# Patient Record
Sex: Female | Born: 1970 | Race: White | Hispanic: No | Marital: Married | Smoking: Never smoker
Health system: Southern US, Community
[De-identification: ages and names within clinical notes are randomized; demographics above are authoritative.]

## PROBLEM LIST (undated history)

## (undated) ENCOUNTER — Inpatient Hospital Stay (HOSPITAL_COMMUNITY): Payer: Self-pay

## (undated) DIAGNOSIS — Z789 Other specified health status: Secondary | ICD-10-CM

## (undated) DIAGNOSIS — N841 Polyp of cervix uteri: Secondary | ICD-10-CM

## (undated) HISTORY — DX: Polyp of cervix uteri: N84.1

## (undated) HISTORY — PX: NO PAST SURGERIES: SHX2092

---

## 2013-06-23 ENCOUNTER — Encounter (HOSPITAL_COMMUNITY): Payer: Self-pay | Admitting: Emergency Medicine

## 2013-06-23 ENCOUNTER — Emergency Department (INDEPENDENT_AMBULATORY_CARE_PROVIDER_SITE_OTHER)
Admission: EM | Admit: 2013-06-23 | Discharge: 2013-06-23 | Disposition: A | Discharge status: Home / Self Care | Payer: Medicaid Other | Source: Home / Self Care | Attending: Family Medicine | Admitting: Family Medicine

## 2013-06-23 DIAGNOSIS — N911 Secondary amenorrhea: Secondary | ICD-10-CM

## 2013-06-23 DIAGNOSIS — N912 Amenorrhea, unspecified: Secondary | ICD-10-CM

## 2013-06-23 LAB — POCT URINALYSIS DIP (DEVICE)
BILIRUBIN URINE: NEGATIVE
GLUCOSE, UA: NEGATIVE mg/dL
Ketones, ur: 15 mg/dL — AB
Leukocytes, UA: NEGATIVE
NITRITE: NEGATIVE
PH: 5.5 (ref 5.0–8.0)
Protein, ur: NEGATIVE mg/dL
SPECIFIC GRAVITY, URINE: 1.01 (ref 1.005–1.030)
UROBILINOGEN UA: 0.2 mg/dL (ref 0.0–1.0)

## 2013-06-23 LAB — POCT I-STAT, CHEM 8
BUN: 4 mg/dL — ABNORMAL LOW (ref 6–23)
CALCIUM ION: 1.18 mmol/L (ref 1.12–1.23)
CHLORIDE: 104 meq/L (ref 96–112)
CREATININE: 0.6 mg/dL (ref 0.50–1.10)
Glucose, Bld: 87 mg/dL (ref 70–99)
HEMATOCRIT: 40 % (ref 36.0–46.0)
Hemoglobin: 13.6 g/dL (ref 12.0–15.0)
POTASSIUM: 3.4 meq/L — AB (ref 3.7–5.3)
Sodium: 141 mEq/L (ref 137–147)
TCO2: 22 mmol/L (ref 0–100)

## 2013-06-23 LAB — HCG, SERUM, QUALITATIVE: Preg, Serum: POSITIVE — AB

## 2013-06-23 LAB — POCT PREGNANCY, URINE: Preg Test, Ur: NEGATIVE

## 2013-06-23 NOTE — Discharge Instructions (Signed)
We will call on fri with pregnancy blood test, I feel her problem is stress related and will resolve when she has a cycle.

## 2013-06-23 NOTE — ED Provider Notes (Signed)
CSN: [id number]     Arrival date & time 06/23/13  1641 History   First MD Initiated Contact with Patient 06/23/13 1734     Chief Complaint  Patient presents with  . Weakness   (Consider location/radiation/quality/duration/timing/severity/associated sxs/prior Treatment) Patient is a 43 y.o. female presenting with weakness. The history is provided by the patient and the spouse. The history is limited by a language barrier. Language interpreter used: husband trans.  Weakness This is a new problem. The current episode started more than 2 days ago. Associated symptoms comments: Did home preg test pos today, late on menses, came to [place] just prior to lmp in feb., no birth control.Marland Kitchen.    History reviewed. No pertinent past medical history. History reviewed. No pertinent past surgical history. No family history on file. History  Substance Use Topics  . Smoking status: Never Smoker   . Smokeless tobacco: Not on file  . Alcohol Use: No   OB History   Grav Para Term Preterm Abortions TAB SAB Ect Mult Living                 Review of Systems  Respiratory: Negative.   Cardiovascular: Negative.   Gastrointestinal: Negative.   Genitourinary: Positive for menstrual problem.  Neurological: Positive for weakness.    Allergies  Review of patient's allergies indicates no known allergies.  Home Medications  No current outpatient prescriptions on file. BP 122/76  Pulse 69  Temp(Src) 98.3 F (36.8 C) (Oral)  Resp 18  SpO2 100%  LMP 05/19/2013 Physical Exam  Nursing note and vitals reviewed. Constitutional: She is oriented to person, place, and time. She appears well-developed and well-nourished.  Neck: Normal range of motion. Neck supple.  Cardiovascular: Normal rate and normal heart sounds.   Pulmonary/Chest: Effort normal and breath sounds normal.  Abdominal: Soft. Bowel sounds are normal. She exhibits no mass. There is no tenderness. There is no rebound and no guarding.   Lymphadenopathy:    She has no cervical adenopathy.  Neurological: She is alert and oriented to person, place, and time.  Skin: Skin is warm and dry.    ED Course  Procedures (including critical care time) Labs Review Labs Reviewed  POCT URINALYSIS DIP (DEVICE) - Abnormal; Notable for the following:    Ketones, ur 15 (*)    Hgb urine dipstick TRACE (*)    All other components within normal limits  POCT I-STAT, CHEM 8 - Abnormal; Notable for the following:    Potassium 3.4 (*)    BUN 4 (*)    All other components within normal limits  HCG, SERUM, QUALITATIVE  POCT PREGNANCY, URINE   Imaging Review No results found. i-stat wnl. U/a neg.  MDM   1. Amenorrhea, secondary        Linna HoffJames D Kindl, MD 06/23/13 [phone]

## 2013-06-23 NOTE — ED Notes (Signed)
[place].  Patient's last period was February 26.  Patient has performed a home pregnancy test and reports it is positive.  C/o feeling weak, hot , legs hurting.

## 2013-06-24 ENCOUNTER — Inpatient Hospital Stay (HOSPITAL_COMMUNITY): Payer: Medicaid Other

## 2013-06-24 ENCOUNTER — Telehealth (HOSPITAL_COMMUNITY): Payer: Self-pay

## 2013-06-24 ENCOUNTER — Encounter (HOSPITAL_COMMUNITY): Payer: Self-pay

## 2013-06-24 ENCOUNTER — Inpatient Hospital Stay (HOSPITAL_COMMUNITY)
Admission: AD | Admit: 2013-06-24 | Discharge: 2013-06-24 | Disposition: A | Discharge status: Home / Self Care | Payer: Medicaid Other | Source: Ambulatory Visit | Attending: Obstetrics and Gynecology | Admitting: Obstetrics and Gynecology

## 2013-06-24 DIAGNOSIS — R109 Unspecified abdominal pain: Secondary | ICD-10-CM | POA: Insufficient documentation

## 2013-06-24 DIAGNOSIS — N898 Other specified noninflammatory disorders of vagina: Secondary | ICD-10-CM | POA: Insufficient documentation

## 2013-06-24 DIAGNOSIS — R5383 Other fatigue: Secondary | ICD-10-CM

## 2013-06-24 DIAGNOSIS — R5381 Other malaise: Secondary | ICD-10-CM | POA: Insufficient documentation

## 2013-06-24 DIAGNOSIS — R51 Headache: Secondary | ICD-10-CM | POA: Insufficient documentation

## 2013-06-24 DIAGNOSIS — R110 Nausea: Secondary | ICD-10-CM

## 2013-06-24 DIAGNOSIS — O9989 Other specified diseases and conditions complicating pregnancy, childbirth and the puerperium: Principal | ICD-10-CM

## 2013-06-24 DIAGNOSIS — O26899 Other specified pregnancy related conditions, unspecified trimester: Secondary | ICD-10-CM

## 2013-06-24 DIAGNOSIS — O99891 Other specified diseases and conditions complicating pregnancy: Secondary | ICD-10-CM | POA: Insufficient documentation

## 2013-06-24 DIAGNOSIS — R1084 Generalized abdominal pain: Secondary | ICD-10-CM

## 2013-06-24 HISTORY — DX: Other specified health status: Z78.9

## 2013-06-24 LAB — CBC
HEMATOCRIT: 35.4 % — AB (ref 36.0–46.0)
HEMOGLOBIN: 12 g/dL (ref 12.0–15.0)
MCH: 30.2 pg (ref 26.0–34.0)
MCHC: 33.9 g/dL (ref 30.0–36.0)
MCV: 88.9 fL (ref 78.0–100.0)
Platelets: 214 10*3/uL (ref 150–400)
RBC: 3.98 MIL/uL (ref 3.87–5.11)
RDW: 12 % (ref 11.5–15.5)
WBC: 7.6 10*3/uL (ref 4.0–10.5)

## 2013-06-24 LAB — WET PREP, GENITAL
CLUE CELLS WET PREP: NONE SEEN
TRICH WET PREP: NONE SEEN
YEAST WET PREP: NONE SEEN

## 2013-06-24 LAB — OB RESULTS CONSOLE GC/CHLAMYDIA
Chlamydia: NEGATIVE
GC PROBE AMP, GENITAL: NEGATIVE

## 2013-06-24 LAB — ABO/RH: ABO/RH(D): B POS

## 2013-06-24 LAB — URINALYSIS, ROUTINE W REFLEX MICROSCOPIC
BILIRUBIN URINE: NEGATIVE
Glucose, UA: NEGATIVE mg/dL
KETONES UR: NEGATIVE mg/dL
Leukocytes, UA: NEGATIVE
Nitrite: NEGATIVE
PH: 5.5 (ref 5.0–8.0)
Protein, ur: NEGATIVE mg/dL
SPECIFIC GRAVITY, URINE: 1.01 (ref 1.005–1.030)
Urobilinogen, UA: 0.2 mg/dL (ref 0.0–1.0)

## 2013-06-24 LAB — URINE MICROSCOPIC-ADD ON

## 2013-06-24 LAB — HCG, QUANTITATIVE, PREGNANCY: hCG, Beta Chain, Quant, S: 14759 m[IU]/mL — ABNORMAL HIGH (ref <5)

## 2013-06-24 MED ORDER — ACETAMINOPHEN 325 MG PO TABS
650.0000 mg | ORAL_TABLET | Freq: Once | ORAL | Status: DC
  Filled 2013-06-24: qty 2

## 2013-06-24 NOTE — Discharge Instructions (Signed)
Abdominal Pain During Pregnancy  Belly (abdominal) pain is common during pregnancy. Most of the time, it is not a serious problem. Other times, it can be a sign that something is wrong with the pregnancy. Always tell your doctor if you have belly pain.  HOME CARE  Monitor your belly pain for any changes. The following actions may help you feel better:  · Do not have sex (intercourse) or put anything in your vagina until you feel better.  · Rest until your pain stops.  · Drink clear fluids if you feel sick to your stomach (nauseous). Do not eat solid food until you feel better.  · Only take medicine as told by your doctor.  · Keep all doctor visits as told.  GET HELP RIGHT AWAY IF:   · You are bleeding, leaking fluid, or pieces of tissue come out of your vagina.  · You have more pain or cramping.  · You keep throwing up (vomiting).  · You have pain when you pee (urinate) or have blood in your pee.  · You have a fever.  · You do not feel your baby moving as much.  · You feel very weak or feel like passing out.  · You have trouble breathing, with or without belly pain.  · You have a very bad headache and belly pain.  · You have fluid leaking from your vagina and belly pain.  · You keep having watery poop (diarrhea).  · Your belly pain does not go away after resting, or the pain gets worse.  MAKE SURE YOU:   · Understand these instructions.  · Will watch your condition.  · Will get help right away if you are not doing well or get worse.  Document Released: 02/26/2009 Document Revised: 11/10/2012 Document Reviewed: 10/07/2012  ExitCare® Patient Information ©2014 [employer].

## 2013-06-24 NOTE — ED Notes (Signed)
Patient's husband called asking for lab results.  Chart reviewed by Dr Artis FlockKindl.  Patient unable to speak English husband is Nurse, learning disabilitytranslator;  Husband made aware that blood test was positive for pregnancy and [facility].  Husband expressed understanding and was given address as well as [facility] for [facility]

## 2013-06-24 NOTE — MAU Note (Signed)
Patient was seen at Urgent Care yesterday and had a positive serum pregnancy test. [facility]uru[facility], headache and nausea. Denies bleeding.

## 2013-06-24 NOTE — MAU Provider Note (Signed)
CSN: [id number]     Arrival date & time 06/24/13  1713 History   None    Chief Complaint  Patient presents with  . Abdominal Pain  . Headache  . Nausea     (Consider location/radiation/quality/duration/timing/severity/associated sxs/prior Treatment) Patient is a 43 y.o. female presenting with abdominal pain.  Abdominal Pain The primary symptoms of the illness include abdominal pain, fatigue and vaginal discharge. The primary symptoms of the illness do not include fever, shortness of breath, nausea, vomiting, diarrhea, dysuria or vaginal bleeding. The current episode started yesterday. The onset of the illness was gradual.  The vaginal discharge is not associated with dysuria.   The patient states that she believes she is currently pregnant. Additional symptoms associated with the illness include back pain. Symptoms associated with the illness do not include chills.   Jill [name] is a 42 y.o. female who presents to the ED with abdominal cramping, headache and feeling light headed for the past couple days. She went to Urgent Care yesterday and had negative UPT but a positive Serum pregnancy. This is her fist pregnancy.    No past medical history on file. No past surgical history on file. No family history on file. History  Substance Use Topics  . Smoking status: Never Smoker   . Smokeless tobacco: Not on file  . Alcohol Use: No   OB History   Grav Para Term Preterm Abortions TAB SAB Ect Mult Living   1              Review of Systems  Constitutional: Positive for fatigue. Negative for fever and chills.  Eyes: Negative for visual disturbance.  Respiratory: Negative for shortness of breath.   Cardiovascular: Negative for chest pain.  Gastrointestinal: Positive for abdominal pain. Negative for nausea, vomiting and diarrhea.  Genitourinary: Positive for vaginal discharge. Negative for dysuria and vaginal bleeding.  Musculoskeletal: Positive for back pain. Negative for myalgias.   Skin: Negative for rash.  Neurological: Positive for light-headedness and headaches.  Psychiatric/Behavioral: The patient is not nervous/anxious.       Allergies  Review of patient's allergies indicates no known allergies.  Home Medications  No current outpatient prescriptions on file. BP 108/75  Pulse 83  Temp(Src) 99.1 F (37.3 C) (Oral)  Resp 16  Ht 4\' 11"  (1.499 m)  Wt 109 lb 9.6 oz (49.714 kg)  BMI 22.12 kg/m2  SpO2 99%  LMP 05/19/2013 Physical Exam  Nursing note and vitals reviewed. Constitutional: She is oriented to person, place, and time. She appears well-developed and well-nourished. No distress.  HENT:  Head: Normocephalic.  Eyes: EOM are normal.  Neck: Neck supple.  Cardiovascular: Normal rate.   Pulmonary/Chest: Effort normal.  Abdominal: Soft. There is tenderness.  Genitourinary:  External genitalia without lesions, cervix long, closed, bilateral adnexal tenderness, uterus slightly enlarged.   Musculoskeletal: Normal range of motion.  Neurological: She is alert and oriented to person, place, and time. No cranial nerve deficit.  Skin: Skin is warm and dry.  Psychiatric: She has a normal mood and affect. Her behavior is normal.   Results for orders placed during the hospital encounter of 06/24/13 (from the past 24 hour(s))  URINALYSIS, ROUTINE W REFLEX MICROSCOPIC     Status: Abnormal   Collection Time    06/24/13  5:35 PM      Result Value Ref Range   Color, Urine YELLOW  YELLOW   APPearance CLEAR  CLEAR   Specific Gravity, Urine 1.010  1.005 - 1.030  pH 5.5  5.0 - 8.0   Glucose, UA NEGATIVE  NEGATIVE mg/dL   Hgb urine dipstick TRACE (*) NEGATIVE   Bilirubin Urine NEGATIVE  NEGATIVE   Ketones, ur NEGATIVE  NEGATIVE mg/dL   Protein, ur NEGATIVE  NEGATIVE mg/dL   Urobilinogen, UA 0.2  0.0 - 1.0 mg/dL   Nitrite NEGATIVE  NEGATIVE   Leukocytes, UA NEGATIVE  NEGATIVE  URINE MICROSCOPIC-ADD ON     Status: Abnormal   Collection Time    06/24/13  5:35  PM      Result Value Ref Range   Squamous Epithelial / LPF FEW (*) RARE  CBC     Status: Abnormal   Collection Time    06/24/13  6:00 PM      Result Value Ref Range   WBC 7.6  4.0 - 10.5 K/uL   RBC 3.98  3.87 - 5.11 MIL/uL   Hemoglobin 12.0  12.0 - 15.0 g/dL   HCT 09.8 (*) 11.9 - 14.7 %   MCV 88.9  78.0 - 100.0 fL   MCH 30.2  26.0 - 34.0 pg   MCHC 33.9  30.0 - 36.0 g/dL   RDW 82.9  56.2 - 13.0 %   Platelets 214  150 - 400 K/uL  ABO/RH     Status: None   Collection Time    06/24/13  6:00 PM      Result Value Ref Range   ABO/RH(D) B POS    HCG, QUANTITATIVE, PREGNANCY     Status: Abnormal   Collection Time    06/24/13  6:00 PM      Result Value Ref Range   hCG, Beta Chain, Quant, S 14759 (*) <5 mIU/mL   US Ob Comp Less 14 Wks  06/24/2013   CLINICAL DATA:  Pain.  EXAM: OBSTETRIC <14 WK [place] AND TRANSVAGINAL OB US  TECHNIQUE: Both transabdominal and transvaginal ultrasound examinations were performed for complete evaluation of the gestation as well as the maternal uterus, adnexal regions, and pelvic cul-de-sac. Transvaginal technique was performed to assess early pregnancy.  COMPARISON:  None.  FINDINGS: Intrauterine gestational sac: Visualized/normal in shape.  Yolk sac:  Yes.  Embryo:  None identified.  MSD:  8.8   mm   5 w   4  d  [place] EDC: 02/20/2014.  Maternal uterus/adnexae: 1.7 x 1.0 x 1.1 cm cyst, most likely corpus luteal cyst, right ovary.  No free pelvic fluid.  IMPRESSION: Findings most consistent with early 5 week 4 day intrauterine pregnancy. No fetal pole is identified at this time, a process such as ectopic pregnancy cannot be completely excluded. Follow-up pelvic ultrasound and follow-up beta HCG suggested for continued evaluation.   Electronically Signed   By: Maisie Fus  Register   On: 06/24/2013 19:50   US Ob Transvaginal  06/24/2013   CLINICAL DATA:  Pain.  EXAM: OBSTETRIC <14 WK [place] AND TRANSVAGINAL OB US  TECHNIQUE: Both transabdominal and transvaginal ultrasound examinations  were performed for complete evaluation of the gestation as well as the maternal uterus, adnexal regions, and pelvic cul-de-sac. Transvaginal technique was performed to assess early pregnancy.  COMPARISON:  None.  FINDINGS: Intrauterine gestational sac: Visualized/normal in shape.  Yolk sac:  Yes.  Embryo:  None identified.  MSD:  8.8   mm   5 w   4  d  [place] EDC: 02/20/2014.  Maternal uterus/adnexae: 1.7 x 1.0 x 1.1 cm cyst, most likely corpus luteal cyst, right ovary.  No free pelvic fluid.  IMPRESSION:  Findings most consistent with early 5 week 4 day intrauterine pregnancy. No fetal pole is identified at this time, a process such as ectopic pregnancy cannot be completely excluded. Follow-up pelvic ultrasound and follow-up beta HCG suggested for continued evaluation.   Electronically Signed   By: Maisie Fushomas  Register   On: 06/24/2013 19:50    ED Course  Procedures  Dr. Emelda FearFerguson in to see the patient and discuss ultrasound results. MDM  43 y.o. female with abdominal pain and feeling weak in early pregnancy. Stable for discharge with normal labs and ultrasound showing a 5 week 4 days IUGS with YS. She [facility]. Message sent to clinic for schedule follow up.

## 2013-06-24 NOTE — MAU Note (Signed)
Speaks ChadPersian, so her husband translates for her;  C/o leg pain, cramping and headache; c/o yellowish vaginal discharge;

## 2013-06-25 LAB — GC/CHLAMYDIA PROBE AMP
CT PROBE, AMP APTIMA: NEGATIVE
GC PROBE AMP APTIMA: NEGATIVE

## 2013-06-28 NOTE — MAU Provider Note (Signed)
`````  Attestation of Attending Supervision of Advanced Practitioner: Evaluation and management procedures were performed by the PA/NP/CNM/OB Fellow under my supervision/collaboration. Chart reviewed and agree with management and plan.  Devonte Migues V 06/28/2013 11:01 PM    

## 2013-06-29 ENCOUNTER — Encounter (HOSPITAL_COMMUNITY): Payer: Self-pay

## 2013-06-29 ENCOUNTER — Inpatient Hospital Stay (HOSPITAL_COMMUNITY)
Admission: AD | Admit: 2013-06-29 | Discharge: 2013-06-29 | Disposition: A | Discharge status: Home / Self Care | Payer: Medicaid Other | Source: Ambulatory Visit | Attending: Family Medicine | Admitting: Family Medicine

## 2013-06-29 DIAGNOSIS — O99891 Other specified diseases and conditions complicating pregnancy: Secondary | ICD-10-CM | POA: Insufficient documentation

## 2013-06-29 DIAGNOSIS — O9989 Other specified diseases and conditions complicating pregnancy, childbirth and the puerperium: Secondary | ICD-10-CM

## 2013-06-29 DIAGNOSIS — R5381 Other malaise: Secondary | ICD-10-CM | POA: Insufficient documentation

## 2013-06-29 DIAGNOSIS — R51 Headache: Secondary | ICD-10-CM | POA: Insufficient documentation

## 2013-06-29 DIAGNOSIS — R5383 Other fatigue: Secondary | ICD-10-CM

## 2013-06-29 DIAGNOSIS — O210 Mild hyperemesis gravidarum: Secondary | ICD-10-CM | POA: Insufficient documentation

## 2013-06-29 DIAGNOSIS — O26811 Pregnancy related exhaustion and fatigue, first trimester: Secondary | ICD-10-CM

## 2013-06-29 LAB — COMPREHENSIVE METABOLIC PANEL
ALK PHOS: 39 U/L (ref 39–117)
ALT: 8 U/L (ref 0–35)
AST: 13 U/L (ref 0–37)
Albumin: 3.8 g/dL (ref 3.5–5.2)
BUN: 6 mg/dL (ref 6–23)
CO2: 23 mEq/L (ref 19–32)
CREATININE: 0.64 mg/dL (ref 0.50–1.10)
Calcium: 9.1 mg/dL (ref 8.4–10.5)
Chloride: 102 mEq/L (ref 96–112)
GFR calc non Af Amer: >90 mL/min (ref >90)
GLUCOSE: 91 mg/dL (ref 70–99)
Potassium: 4 mEq/L (ref 3.7–5.3)
Sodium: 137 mEq/L (ref 137–147)
TOTAL PROTEIN: 6.6 g/dL (ref 6.0–8.3)
Total Bilirubin: 0.5 mg/dL (ref 0.3–1.2)

## 2013-06-29 LAB — URINALYSIS, ROUTINE W REFLEX MICROSCOPIC
BILIRUBIN URINE: NEGATIVE
Glucose, UA: NEGATIVE mg/dL
HGB URINE DIPSTICK: NEGATIVE
KETONES UR: NEGATIVE mg/dL
Leukocytes, UA: NEGATIVE
Nitrite: NEGATIVE
Protein, ur: NEGATIVE mg/dL
UROBILINOGEN UA: 0.2 mg/dL (ref 0.0–1.0)
pH: 6 (ref 5.0–8.0)

## 2013-06-29 NOTE — MAU Provider Note (Signed)
History     CSN: [id number]  Arrival date and time: 06/29/13 1112   None     Chief Complaint  Patient presents with  . Headache  . Nausea  . Fatigue   HPI  Pt is 43 yo G1P0 at 4384w6d wks IUP here with report of headache, fatigue, and dizziness for past two days.  Reports not eating due to lack of desire.  +nausea, but no vomiting.  No report of fever or chills.  Legs feel weak when walking.  No vaginal bleeding or pelvic pain.    Past Medical History  Diagnosis Date  . Medical history non-contributory     Past Surgical History  Procedure Laterality Date  . No past surgeries      Family History  Problem Relation Age of Onset  . Diabetes Mother   . Heart disease Mother     History  Substance Use Topics  . Smoking status: Never Smoker   . Smokeless tobacco: Not on file  . Alcohol Use: No    Allergies: No Known Allergies  No prescriptions prior to admission    Review of Systems  Constitutional: Positive for malaise/fatigue. Negative for fever and chills.  HENT: Negative for sore throat.   Cardiovascular: Negative for chest pain.  Gastrointestinal: Positive for nausea. Negative for vomiting, abdominal pain, diarrhea and constipation.  Genitourinary: Negative for dysuria.  Neurological: Positive for dizziness, weakness and headaches. Negative for loss of consciousness.   Physical Exam   Blood pressure 100/57, pulse 72, temperature 98.2 F (36.8 C), temperature source Oral, resp. rate 16, height 4\' 11"  (1.499 m), weight 49.351 kg (108 lb 12.8 oz), last menstrual period 05/19/2013.  Physical Exam  Constitutional: She is oriented to person, place, and time. She appears well-developed and well-nourished. No distress.  HENT:  Head: Normocephalic.  Mouth/Throat: Mucous membranes are not dry.  Neck: Normal range of motion. Neck supple.  Cardiovascular: Normal rate, regular rhythm and normal heart sounds.   Respiratory: Effort normal and breath sounds normal. No  respiratory distress.  GI: Soft. There is no tenderness.  Genitourinary: Right adnexum displays no mass, no tenderness and no fullness. Left adnexum displays no mass, no tenderness and no fullness. No bleeding around the vagina.  Musculoskeletal: Normal range of motion.       Right shoulder: She exhibits normal range of motion and normal strength.  Neurological: She is alert and oriented to person, place, and time.  Skin: Skin is warm and dry.    MAU Course  Procedures Results for orders placed during the hospital encounter of 06/29/13 (from the past 24 hour(s))  URINALYSIS, ROUTINE W REFLEX MICROSCOPIC     Status: Abnormal   Collection Time    06/29/13 11:35 AM      Result Value Ref Range   Color, Urine YELLOW  YELLOW   APPearance CLEAR  CLEAR   Specific Gravity, Urine <1.005 (*) 1.005 - 1.030   pH 6.0  5.0 - 8.0   Glucose, UA NEGATIVE  NEGATIVE mg/dL   Hgb urine dipstick NEGATIVE  NEGATIVE   Bilirubin Urine NEGATIVE  NEGATIVE   Ketones, ur NEGATIVE  NEGATIVE mg/dL   Protein, ur NEGATIVE  NEGATIVE mg/dL   Urobilinogen, UA 0.2  0.0 - 1.0 mg/dL   Nitrite NEGATIVE  NEGATIVE   Leukocytes, UA NEGATIVE  NEGATIVE  COMPREHENSIVE METABOLIC PANEL     Status: None   Collection Time    06/29/13  1:41 PM      Result Value  Ref Range   Sodium 137  137 - 147 mEq/L   Potassium 4.0  3.7 - 5.3 mEq/L   Chloride 102  96 - 112 mEq/L   CO2 23  19 - 32 mEq/L   Glucose, Bld 91  70 - 99 mg/dL   BUN 6  6 - 23 mg/dL   Creatinine, Ser 4.09  0.50 - 1.10 mg/dL   Calcium 9.1  8.4 - 81.1 mg/dL   Total Protein 6.6  6.0 - 8.3 g/dL   Albumin 3.8  3.5 - 5.2 g/dL   AST 13  0 - 37 U/L   ALT 8  0 - 35 U/L   Alkaline Phosphatase 39  39 - 117 U/L   Total Bilirubin 0.5  0.3 - 1.2 mg/dL   GFR calc non Af Amer >90  >90 mL/min   GFR calc Af Amer >90  >90 mL/min     Assessment and Plan  Fatigue of Pregnancy  Plan: Advised increase rest and fluids. Tylenol as needed for headache.  Emphasized importance of  food intake. Begin prenatal care.  Eino Farber N Muhammad 06/29/2013, 1:30 PM

## 2013-06-29 NOTE — MAU Note (Addendum)
Been feeling dizzy, legs are weak. Ongoing headache.  Tylenol when she takes it helps, but she is afraid to take it.  Been feeling nauseated

## 2013-06-29 NOTE — Discharge Instructions (Signed)
For nausea and vomiting: Unisom 12.5 mg at bedtime Vitamin B6 25mg  at bedtime and in the morning.  HEALTHY EATING PLAN  The My Pyramid plan for Moms outlines what you should eat to help you and your baby stay healthy.   HOME CARE INSTRUCTIONS  For a personal plan based on your unique needs, see your Registered Dietitian or visit collegescenetv.comwww.mypyramid.gov.  Eat a variety of foods. The plan below will help guide you. The following chart has a suggested daily meal plan from the My Pyramid for Moms.  Eat a variety of fruits and vegetables.  Eat more dark green and orange vegetables and cooked dried beans.  Make half your grains whole grains. Choose whole instead of refined grains.  Choose low-fat or lean meats and poultry.  Choose low-fat or fat-free dairy products like milk, cheese, or yogurt. Fruits  Non-Breastfeeding: 2 cups  What Counts as a serving?  1 cup of fruit or juice.   cup dried fruit. Vegetables  Non-Breastfeeding: 2  cups  What Counts as a serving?  1 cup raw or cooked vegetables.  Juice or 2 cups raw leafy vegetables. Grains  Non-Breastfeeding: 6 oz  What Counts as a serving?  1 slice bread.  1 oz ready-to-eat cereal.   cup cooked pasta, rice, or cereal. Meat and Beans  Non-Breastfeeding: 5  oz  What Counts as a serving?  1 oz lean meat, poultry, or fish   cup cooked dry beans   oz nuts or 1 egg  1 tbs peanut butter Milk  Non-Breastfeeding: 3 cups  What Counts as a serving?  1 cup milk.  8 oz yogurt.  1  oz cheese.  2 oz processed cheese.

## 2013-06-30 NOTE — MAU Provider Note (Signed)
Attestation of Attending Supervision of Advanced Practitioner (PA/CNM/NP): Evaluation and management procedures were performed by the Advanced Practitioner under my supervision and collaboration.  I have reviewed the Advanced Practitioner's note and chart, and I agree with the management and plan.  Dashonda Bonneau, DO Attending Physician Faculty Practice, [facility]  

## 2013-07-01 ENCOUNTER — Encounter: Payer: Self-pay | Admitting: Family Medicine

## 2013-07-06 ENCOUNTER — Ambulatory Visit: Payer: Self-pay

## 2013-07-15 ENCOUNTER — Encounter (HOSPITAL_COMMUNITY): Payer: Self-pay

## 2013-07-15 ENCOUNTER — Inpatient Hospital Stay (HOSPITAL_COMMUNITY): Payer: Medicaid Other

## 2013-07-15 ENCOUNTER — Inpatient Hospital Stay (HOSPITAL_COMMUNITY)
Admission: AD | Admit: 2013-07-15 | Discharge: 2013-07-16 | Disposition: A | Discharge status: Home / Self Care | Payer: Medicaid Other | Source: Ambulatory Visit | Attending: Obstetrics & Gynecology | Admitting: Obstetrics & Gynecology

## 2013-07-15 DIAGNOSIS — O30001 Twin pregnancy, unspecified number of placenta and unspecified number of amniotic sacs, first trimester: Secondary | ICD-10-CM

## 2013-07-15 DIAGNOSIS — O3441 Maternal care for other abnormalities of cervix, first trimester: Secondary | ICD-10-CM | POA: Diagnosis present

## 2013-07-15 DIAGNOSIS — O30009 Twin pregnancy, unspecified number of placenta and unspecified number of amniotic sacs, unspecified trimester: Secondary | ICD-10-CM | POA: Insufficient documentation

## 2013-07-15 DIAGNOSIS — O30031 Twin pregnancy, monochorionic/diamniotic, first trimester: Secondary | ICD-10-CM | POA: Diagnosis present

## 2013-07-15 DIAGNOSIS — K5900 Constipation, unspecified: Secondary | ICD-10-CM | POA: Insufficient documentation

## 2013-07-15 DIAGNOSIS — O208 Other hemorrhage in early pregnancy: Secondary | ICD-10-CM | POA: Insufficient documentation

## 2013-07-15 DIAGNOSIS — N841 Polyp of cervix uteri: Secondary | ICD-10-CM

## 2013-07-15 DIAGNOSIS — O3440 Maternal care for other abnormalities of cervix, unspecified trimester: Secondary | ICD-10-CM | POA: Insufficient documentation

## 2013-07-15 DIAGNOSIS — N949 Unspecified condition associated with female genital organs and menstrual cycle: Secondary | ICD-10-CM | POA: Insufficient documentation

## 2013-07-15 LAB — URINALYSIS, ROUTINE W REFLEX MICROSCOPIC
Bilirubin Urine: NEGATIVE
Glucose, UA: NEGATIVE mg/dL
Hgb urine dipstick: NEGATIVE
KETONES UR: 15 mg/dL — AB
Leukocytes, UA: NEGATIVE
NITRITE: NEGATIVE
PH: 5.5 (ref 5.0–8.0)
Protein, ur: NEGATIVE mg/dL
Specific Gravity, Urine: 1.015 (ref 1.005–1.030)
Urobilinogen, UA: 0.2 mg/dL (ref 0.0–1.0)

## 2013-07-15 LAB — POCT PREGNANCY, URINE: Preg Test, Ur: POSITIVE — AB

## 2013-07-15 NOTE — MAU Note (Signed)
Vaginal pain while sitting started 3-4 days ago but worse today.  Feels like something "like a piece of meat is coming out".  Denies bleeding.

## 2013-07-15 NOTE — MAU Provider Note (Signed)
Chief Complaint: Vaginal Pain   First Provider Initiated Contact with Patient 07/15/13 2249     SUBJECTIVE HPI: Jill [name] is a 43 y.o. G1P0 at 2928w1d by LMP who presents to maternity admissions reporting a sensation that "something was coming out" while having a bowel movement and pain when sitting down afterwards.  She does report some mild constipation x2-3 days but with BM today.  She denies abdominal pain, vaginal bleeding, vaginal itching/burning, urinary symptoms, h/a, dizziness, n/v, or fever/chills.   Pt is taking Unisom/Vitamin B6 which is helping her nausea.  Her husband reports she is afraid to gain weight in this pregnancy and asks questions about how many calories she should eat daily.   Past Medical History  Diagnosis Date  . Medical history non-contributory    Past Surgical History  Procedure Laterality Date  . No past surgeries     History   Social History  . Marital Status: Married    Spouse Name: N/A    Number of Children: N/A  . Years of Education: N/A   Occupational History  . Not on file.   Social History Main Topics  . Smoking status: Never Smoker   . Smokeless tobacco: Never Used  . Alcohol Use: No  . Drug Use: No  . Sexual Activity: Not on file   Other Topics Concern  . Not on file   Social History Narrative  . No narrative on file   No current facility-administered medications on file prior to encounter.   No current outpatient prescriptions on file prior to encounter.   No Known Allergies  ROS: Pertinent items in HPI  OBJECTIVE Blood pressure 117/78, pulse 94, temperature 98.1 F (36.7 C), temperature source Oral, resp. rate 18, height 4' 11.75" (1.518 m), weight 49.079 kg (108 lb 3.2 oz), last menstrual period 05/19/2013, SpO2 100.00%. GENERAL: Well-developed, well-nourished female in no acute distress.  HEENT: Normocephalic HEART: normal rate RESP: normal effort ABDOMEN: Soft, non-tender EXTREMITIES: Nontender, no edema NEURO:  Alert and oriented Pelvic exam: Cervix pink, visually closed, with 1x1x4cm polyp extending from cervical os, scant white creamy discharge, vaginal walls and external genitalia normal  Of note, pt extremely uncomfortable with cervical exam, tearful, leaning on and looking to her husband for support during exam.    LAB RESULTS Results for orders placed during the hospital encounter of 07/15/13 (from the past 24 hour(s))  URINALYSIS, ROUTINE W REFLEX MICROSCOPIC     Status: Abnormal   Collection Time    07/15/13 10:00 PM      Result Value Ref Range   Color, Urine YELLOW  YELLOW   APPearance CLEAR  CLEAR   Specific Gravity, Urine 1.015  1.005 - 1.030   pH 5.5  5.0 - 8.0   Glucose, UA NEGATIVE  NEGATIVE mg/dL   Hgb urine dipstick NEGATIVE  NEGATIVE   Bilirubin Urine NEGATIVE  NEGATIVE   Ketones, ur 15 (*) NEGATIVE mg/dL   Protein, ur NEGATIVE  NEGATIVE mg/dL   Urobilinogen, UA 0.2  0.0 - 1.0 mg/dL   Nitrite NEGATIVE  NEGATIVE   Leukocytes, UA NEGATIVE  NEGATIVE  POCT PREGNANCY, URINE     Status: Abnormal   Collection Time    07/15/13 10:12 PM      Result Value Ref Range   Preg Test, Ur POSITIVE (*) NEGATIVE    IMAGING [place] Ob Comp Less 14 Wks  07/16/2013   CLINICAL DATA:  Twin pregnancy  EXAM: TWIN OBSTETRICAL ULTRASOUND <14 WKS  COMPARISON:  None.  FINDINGS: TWIN 1  Intrauterine gestational sac: Visualized/normal in shape.  Yolk sac:  Present  Embryo:  Present  Cardiac Activity: Present  Heart Rate: 176 bpm  CRL:   20.4  mm   8 w 5 d                  US EDC: 02/19/2014  TWIN 2  Intrauterine gestational sac: Visualized/normal in shape.  Yolk sac:  Present  Embryo:  Present  Cardiac Activity: Present  Heart Rate: 179 bpm  CRL:   18.4  mm   8 w 3 d                  US EDC: 02/21/2014  Maternal uterus/adnexae:  Single gestational sac with dividing membrane.  Small subchorionic hemorrhage.  Uterus otherwise normal in appearance.  No free pelvic fluid.  Cervical polyp noted on physical exam is  not sonographically evident.  IMPRESSION: Live twin intrauterine gestations as above.  Small subchorionic hemorrhage.   Electronically Signed   By: Ulyses SouthwardMark  Boles M.D.   On: 07/16/2013 01:23   [place] Ob Comp Addl Gest Less 14 Wks  07/16/2013   CLINICAL DATA:  Twin pregnancy  EXAM: TWIN OBSTETRICAL ULTRASOUND <14 WKS  COMPARISON:  None.  FINDINGS: TWIN 1  Intrauterine gestational sac: Visualized/normal in shape.  Yolk sac:  Present  Embryo:  Present  Cardiac Activity: Present  Heart Rate: 176 bpm  CRL:   20.4  mm   8 w 5 d                  US EDC: 02/19/2014  TWIN 2  Intrauterine gestational sac: Visualized/normal in shape.  Yolk sac:  Present  Embryo:  Present  Cardiac Activity: Present  Heart Rate: 179 bpm  CRL:   18.4  mm   8 w 3 d                  US EDC: 02/21/2014  Maternal uterus/adnexae:  Single gestational sac with dividing membrane.  Small subchorionic hemorrhage.  Uterus otherwise normal in appearance.  No free pelvic fluid.  Cervical polyp noted on physical exam is not sonographically evident.  IMPRESSION: Live twin intrauterine gestations as above.  Small subchorionic hemorrhage.   Electronically Signed   By: Ulyses SouthwardMark  Boles M.D.   On: 07/16/2013 01:23   [place] Ob Comp Less 14 Wks  06/24/2013   CLINICAL DATA:  Pain.  EXAM: OBSTETRIC <14 WK US AND TRANSVAGINAL OB US  TECHNIQUE: Both transabdominal and transvaginal ultrasound examinations were performed for complete evaluation of the gestation as well as the maternal uterus, adnexal regions, and pelvic cul-de-sac. Transvaginal technique was performed to assess early pregnancy.  COMPARISON:  None.  FINDINGS: Intrauterine gestational sac: Visualized/normal in shape.  Yolk sac:  Yes.  Embryo:  None identified.  MSD:  8.8   mm   5 w   4  d  US EDC: 02/20/2014.  Maternal uterus/adnexae: 1.7 x 1.0 x 1.1 cm cyst, most likely corpus luteal cyst, right ovary.  No free pelvic fluid.  IMPRESSION: Findings most consistent with early 5 week 4 day intrauterine pregnancy. No fetal  pole is identified at this time, a process such as ectopic pregnancy cannot be completely excluded. Follow-up pelvic ultrasound and follow-up beta HCG suggested for continued evaluation.   Electronically Signed   By: Maisie Fushomas  Register   On: 06/24/2013 19:50   [place] Ob Transvaginal  06/24/2013   CLINICAL DATA:  Pain.  EXAM: OBSTETRIC <14 WK [place] AND TRANSVAGINAL OB US  TECHNIQUE: Both transabdominal and transvaginal ultrasound examinations were performed for complete evaluation of the gestation as well as the maternal uterus, adnexal regions, and pelvic cul-de-sac. Transvaginal technique was performed to assess early pregnancy.  COMPARISON:  None.  FINDINGS: Intrauterine gestational sac: Visualized/normal in shape.  Yolk sac:  Yes.  Embryo:  None identified.  MSD:  8.8   mm   5 w   4  d  [place] EDC: 02/20/2014.  Maternal uterus/adnexae: 1.7 x 1.0 x 1.1 cm cyst, most likely corpus luteal cyst, right ovary.  No free pelvic fluid.  IMPRESSION: Findings most consistent with early 5 week 4 day intrauterine pregnancy. No fetal pole is identified at this time, a process such as ectopic pregnancy cannot be completely excluded. Follow-up pelvic ultrasound and follow-up beta HCG suggested for continued evaluation.   Electronically Signed   By: Maisie Fus  Register   On: 06/24/2013 19:50    ASSESSMENT 1. Polyp of cervix affecting pregnancy in first trimester   2. Twin pregnancy, twins concordant in first trimester     PLAN Discharge home Keep scheduled appointment in [facility] on May 7 Discussed recommended weight gain for twins pregnancy Discussed options for cervical polyp, including removal or leaving the polyp during the pregnancy.  Pt unsure at this time, and will reevaluate at clinic visits.  Return to MAU as needed for emergencies    Medication List         doxylamine (Sleep) 25 MG tablet  Commonly known as:  UNISOM  Take 25 mg by mouth at bedtime.     pyridOXINE 100 MG tablet  Commonly known as:  VITAMIN B-6   Take 100 mg by mouth daily. States she takes half a pill in morning and other half at night.       Follow-up Information   Follow up with [facility]. (As scheduled on Jul 26, 2013.  Return to MAU as needed for emergencies. )    Specialty:  Obstetrics and Gynecology   Contact information:   [address] [place] [address] [phone]      Sharen Counter Certified Nurse-Midwife 07/16/2013  1:35 AM

## 2013-07-16 DIAGNOSIS — O3441 Maternal care for other abnormalities of cervix, first trimester: Secondary | ICD-10-CM

## 2013-07-16 DIAGNOSIS — O30031 Twin pregnancy, monochorionic/diamniotic, first trimester: Secondary | ICD-10-CM | POA: Diagnosis present

## 2013-07-16 DIAGNOSIS — N841 Polyp of cervix uteri: Secondary | ICD-10-CM | POA: Diagnosis present

## 2013-07-16 NOTE — Progress Notes (Signed)
Lisa Leftwich-Kirby CNM in earlier to discuss test results and d/c plan. Written and verbal d/c instructions given and understanding voiced. 

## 2013-07-16 NOTE — Discharge Instructions (Signed)
Pregnancy - First Trimester  During sexual intercourse, millions of sperm go into the vagina. Only 1 sperm will penetrate and fertilize the female egg while it is in the Fallopian tube. One week later, the fertilized egg implants into the wall of the uterus. An embryo begins to develop into a baby. At 6 to 8 weeks, the eyes and face are formed and the heartbeat can be seen on ultrasound. At the end of 12 weeks (first trimester), all the baby's organs are formed. Now that you are pregnant, you will want to do everything you can to have a healthy baby. Two of the most important things are to get good prenatal care and follow your caregiver's instructions. Prenatal care is all the medical care you receive before the baby's birth. It is given to prevent, find, and treat problems during the pregnancy and childbirth.  PRENATAL EXAMS  · During prenatal visits, your weight, blood pressure, and urine are checked. This is done to make sure you are healthy and progressing normally during the pregnancy.  · A pregnant woman should gain 25 to 35 pounds during the pregnancy. However, if you are overweight or underweight, your caregiver will advise you regarding your weight.  · Your caregiver will ask and answer questions for you.  · Blood work, cervical cultures, other necessary tests, and a Pap test are done during your prenatal exams. These tests are done to check on your health and the probable health of your baby. Tests are strongly recommended and done for HIV with your permission. This is the virus that causes AIDS. These tests are done because medicines can be given to help prevent your baby from being born with this infection should you have been infected without knowing it. Blood work is also used to find out your blood type, previous infections, and follow your blood levels (hemoglobin).  · Low hemoglobin (anemia) is common during pregnancy. Iron and vitamins are given to help prevent this. Later in the pregnancy, blood  tests for diabetes will be done along with any other tests if any problems develop.  · You may need other tests to make sure you and the baby are doing well.  CHANGES DURING THE FIRST TRIMESTER   Your body goes through many changes during pregnancy. They vary from person to person. Talk to your caregiver about changes you notice and are concerned about. Changes can include:  · Your menstrual period stops.  · The egg and sperm carry the genes that determine what you look like. Genes from you and your partner are forming a baby. The female genes determine whether the baby is a boy or a girl.  · Your body increases in girth and you may feel bloated.  · Feeling sick to your stomach (nauseous) and throwing up (vomiting). If the vomiting is uncontrollable, call your caregiver.  · Your breasts will begin to enlarge and become tender.  · Your nipples may stick out more and become darker.  · The need to urinate more. Painful urination may mean you have a bladder infection.  · Tiring easily.  · Loss of appetite.  · Cravings for certain kinds of food.  · At first, you may gain or lose a couple of pounds.  · You may have changes in your emotions from day to day (excited to be pregnant or concerned something may go wrong with the pregnancy and baby).  · You may have more vivid and strange dreams.  HOME CARE INSTRUCTIONS   ·   It is very important to avoid all smoking, alcohol and non-prescribed drugs during your pregnancy. These affect the formation and growth of the baby. Avoid chemicals while pregnant to ensure the delivery of a healthy infant.  · Start your prenatal visits by the 12th week of pregnancy. They are usually scheduled monthly at first, then more often in the last 2 months before delivery. Keep your caregiver's appointments. Follow your caregiver's instructions regarding medicine use, blood and lab tests, exercise, and diet.  · During pregnancy, you are providing food for you and your baby. Eat regular, well-balanced  meals. Choose foods such as meat, fish, milk and other low fat dairy products, vegetables, fruits, and whole-grain breads and cereals. Your caregiver will tell you of the ideal weight gain.  · You can help morning sickness by keeping soda crackers at the bedside. Eat a couple before arising in the morning. You may want to use the crackers without salt on them.  · Eating 4 to 5 small meals rather than 3 large meals a day also may help the nausea and vomiting.  · Drinking liquids between meals instead of during meals also seems to help nausea and vomiting.  · A physical sexual relationship may be continued throughout pregnancy if there are no other problems. Problems may be early (premature) leaking of amniotic fluid from the membranes, vaginal bleeding, or belly (abdominal) pain.  · Exercise regularly if there are no restrictions. Check with your caregiver or physical therapist if you are unsure of the safety of some of your exercises. Greater weight gain will occur in the last 2 trimesters of pregnancy. Exercising will help:  · Control your weight.  · Keep you in shape.  · Prepare you for labor and delivery.  · Help you lose your pregnancy weight after you deliver your baby.  · Wear a good support or jogging bra for breast tenderness during pregnancy. This may help if worn during sleep too.  · Ask when prenatal classes are available. Begin classes when they are offered.  · Do not use hot tubs, steam rooms, or saunas.  · Wear your seat belt when driving. This protects you and your baby if you are in an accident.  · Avoid raw meat, uncooked cheese, cat litter boxes, and soil used by cats throughout the pregnancy. These carry germs that can cause birth defects in the baby.  · The first trimester is a good time to visit your dentist for your dental health. Getting your teeth cleaned is okay. Use a softer toothbrush and brush gently during pregnancy.  · Ask for help if you have financial, counseling, or nutritional needs  during pregnancy. Your caregiver will be able to offer counseling for these needs as well as refer you for other special needs.  · Do not take any medicines or herbs unless told by your caregiver.  · Inform your caregiver if there is any mental or physical domestic violence.  · Make a list of emergency phone numbers of family, friends, hospital, and police and fire departments.  · Write down your questions. Take them to your prenatal visit.  · Do not douche.  · Do not cross your legs.  · If you have to stand for long periods of time, rotate you feet or take small steps in a circle.  · You may have more vaginal secretions that may require a sanitary pad. Do not use tampons or scented sanitary pads.  MEDICINES AND DRUG USE IN PREGNANCY  ·   Take prenatal vitamins as directed. The vitamin should contain 1 milligram of folic acid. Keep all vitamins out of reach of children. Only a couple vitamins or tablets containing iron may be fatal to a baby or young child when ingested.  · Avoid use of all medicines, including herbs, over-the-counter medicines, not prescribed or suggested by your caregiver. Only take over-the-counter or prescription medicines for pain, discomfort, or fever as directed by your caregiver. Do not use aspirin, ibuprofen, or naproxen unless directed by your caregiver.  · Let your caregiver also know about herbs you may be using.  · Alcohol is related to a number of birth defects. This includes fetal alcohol syndrome. All alcohol, in any form, should be avoided completely. Smoking will cause low birth rate and premature babies.  · Street or illegal drugs are very harmful to the baby. They are absolutely forbidden. A baby born to an addicted mother will be addicted at birth. The baby will go through the same withdrawal an adult does.  · Let your caregiver know about any medicines that you have to take and for what reason you take them.  SEEK MEDICAL CARE IF:   You have any concerns or worries during your  pregnancy. It is better to call with your questions if you feel they cannot wait, rather than worry about them.  SEEK IMMEDIATE MEDICAL CARE IF:   · An unexplained oral temperature above 102° F (38.9° C) develops, or as your caregiver suggests.  · You have leaking of fluid from the vagina (birth canal). If leaking membranes are suspected, take your temperature and inform your caregiver of this when you call.  · There is vaginal spotting or bleeding. Notify your caregiver of the amount and how many pads are used.  · You develop a bad smelling vaginal discharge with a change in the color.  · You continue to feel sick to your stomach (nauseated) and have no relief from remedies suggested. You vomit blood or coffee ground-like materials.  · You lose more than 2 pounds of weight in 1 week.  · You gain more than 2 pounds of weight in 1 week and you notice swelling of your face, hands, feet, or legs.  · You gain 5 pounds or more in 1 week (even if you do not have swelling of your hands, face, legs, or feet).  · You get exposed to German measles and have never had them.  · You are exposed to fifth disease or chickenpox.  · You develop belly (abdominal) pain. Round ligament discomfort is a common non-cancerous (benign) cause of abdominal pain in pregnancy. Your caregiver still must evaluate this.  · You develop headache, fever, diarrhea, pain with urination, or shortness of breath.  · You fall or are in a car accident or have any kind of trauma.  · There is mental or physical violence in your home.  Document Released: 03/04/2001 Document Revised: 12/03/2011 Document Reviewed: 09/05/2008  [employer]® Patient Information ©2014 [employer].

## 2013-07-19 ENCOUNTER — Encounter (HOSPITAL_COMMUNITY): Payer: Self-pay | Admitting: *Deleted

## 2013-07-19 ENCOUNTER — Inpatient Hospital Stay (HOSPITAL_COMMUNITY)
Admission: AD | Admit: 2013-07-19 | Discharge: 2013-07-19 | Disposition: A | Discharge status: Home / Self Care | Payer: Medicaid Other | Source: Ambulatory Visit | Attending: Obstetrics & Gynecology | Admitting: Obstetrics & Gynecology

## 2013-07-19 DIAGNOSIS — O3440 Maternal care for other abnormalities of cervix, unspecified trimester: Secondary | ICD-10-CM | POA: Insufficient documentation

## 2013-07-19 DIAGNOSIS — N841 Polyp of cervix uteri: Secondary | ICD-10-CM

## 2013-07-19 DIAGNOSIS — O3441 Maternal care for other abnormalities of cervix, first trimester: Secondary | ICD-10-CM

## 2013-07-19 DIAGNOSIS — R109 Unspecified abdominal pain: Secondary | ICD-10-CM | POA: Insufficient documentation

## 2013-07-19 DIAGNOSIS — O30009 Twin pregnancy, unspecified number of placenta and unspecified number of amniotic sacs, unspecified trimester: Secondary | ICD-10-CM | POA: Insufficient documentation

## 2013-07-19 LAB — URINALYSIS, ROUTINE W REFLEX MICROSCOPIC
Bilirubin Urine: NEGATIVE
Glucose, UA: NEGATIVE mg/dL
Hgb urine dipstick: NEGATIVE
Ketones, ur: NEGATIVE mg/dL
NITRITE: NEGATIVE
PH: 6 (ref 5.0–8.0)
Protein, ur: NEGATIVE mg/dL
UROBILINOGEN UA: 0.2 mg/dL (ref 0.0–1.0)

## 2013-07-19 LAB — URINE MICROSCOPIC-ADD ON

## 2013-07-19 MED ORDER — POLYETHYLENE GLYCOL 3350 17 G PO PACK
17.0000 g | PACK | Freq: Every day | ORAL | Status: DC
  Administered 2013-07-19: 17 g via ORAL
  Filled 2013-07-19 (×2): qty 1

## 2013-07-19 NOTE — MAU Provider Note (Signed)
Attestation of Attending Supervision of Advanced Practitioner (CNM/NP): Evaluation and management procedures were performed by the Advanced Practitioner under my supervision and collaboration.  I have reviewed the Advanced Practitioner's note and chart, and I agree with the management and plan.  Bridney Guadarrama Harraway-Smith 7:20 PM     

## 2013-07-19 NOTE — Discharge Instructions (Signed)
We recommend taking stool softeners and fiber supplement to assist with your bowel movements, to reduce the amount of straining and pressure you apply, which makes the polyp painful. - Miralax (over the counter) you may use 1 capful or 17g daily as needed - Metamucil (over the counter) use as instructed For pain, Tylenol is safe to take in pregnancy, less than 3000mg  daily (you may take 1-2 of the 500mg  tablets up to 3 times daily as needed).  Please [facility] your [facility] here in 1 week. Scheduled for Jul 26, 2013. This is the soonest that we can get you in to our [facility]. They will discuss your prenatal care, and plan for future options about treating your polyp.  Prenatal Care  WHAT IS PRENATAL CARE?  Prenatal care means health care during your pregnancy, before your baby is born. It is very important to take care of yourself and your baby during your pregnancy by:   Getting early prenatal care. If you know you are pregnant, or think you might be pregnant, call your health care provider as soon as possible. Schedule a visit for a prenatal exam.  Getting regular prenatal care. Follow your health care provider's schedule for blood and other necessary tests. Do not miss appointments.  Doing everything you can to keep yourself and your baby healthy during your pregnancy.  Getting complete care. Prenatal care should include evaluation of the medical, dietary, educational, psychological, and social needs of you and your significant other. The medical and genetic history of your family and the family of your baby's father should be discussed with your health care provider.  Discussing with your health care provider:  Prescription, over-the-counter, and herbal medicines that you take.  Any history of substance abuse, alcohol use, smoking, and illegal drug use.  Any history of domestic abuse and violence.  Immunizations you have received.  Your nutrition and diet.  The  amount of exercise you do.  Any environmental and occupational hazards to which you are exposed.  History of sexually transmitted infections for both you and your partner.  Previous pregnancies you have had. WHY IS PRENATAL CARE SO IMPORTANT?  By regularly seeing your health care provider, you help ensure that problems can be identified early so that they can be treated as soon as possible. Other problems might be prevented. Many studies have shown that early and regular prenatal care is important for the health of mothers and their babies.  HOW CAN I TAKE CARE OF MYSELF WHILE I AM PREGNANT?  Here are ways to take care of yourself and your baby:   Start or continue taking your multivitamin with 400 micrograms (mcg) of folic acid every day.  Get early and regular prenatal care. It is very important to see a health care provider during your pregnancy. Your health care provider will check at each visit to make sure that you and the baby are healthy. If there are any problems, action can be taken right away to help you and the baby.  Eat a healthy diet that includes:  Fruits.  Vegetables.  Foods low in saturated fat.  Whole grains.  Calcium-rich foods, such as milk, yogurt, and hard cheeses.  Drink 6 to 8 glasses of liquids a day.  Unless your health care provider tells you not to, try to be physically active for 30 minutes, most days of the week. If you are pressed for time, you can get your activity in through 10-minute segments, three times  a day.  Do not smoke, drink alcohol, or use drugs. These can cause long-term damage to your baby. Talk with your health care provider about steps to take to stop smoking. Talk with a member of your faith community, a counselor, a trusted friend, or your health care provider if you are concerned about your alcohol or drug use.  Ask your health care provider before taking any medicine, even over-the-counter medicines. Some medicines are not safe to  take during pregnancy.  Get plenty of rest and sleep.  Avoid hot tubs and saunas during pregnancy.  Do not have X-rays taken unless absolutely necessary and with the recommendation of your health care provider. A lead shield can be placed on your abdomen to protect the baby when X-rays are taken in other parts of the body.  Do not empty the cat litter when you are pregnant. It may contain a parasite that causes an infection called toxoplasmosis, which can cause birth defects. Also, use gloves when working in garden areas used by cats.  Do not eat uncooked or undercooked meats or fish.  Do not eat soft, mold-ripened cheeses (Brie, Camembert, and chevre) or soft, blue-veined cheese (Danish blue and Roquefort).  Stay away from toxic chemicals like:  Insecticides.  Solvents (some cleaners or paint thinners).  Lead.  Mercury.  Sexual intercourse may continue until the end of the pregnancy, unless you have a medical problem or there is a problem with the pregnancy and your health care provider tells you not to.  Do not wear high-heel shoes, especially during the second half of the pregnancy. You can lose your balance and fall.  Do not take long trips, unless absolutely necessary. Be sure to see your health care provider before going on the trip.  Do not sit in one position for more than 2 hours when on a trip.  Take a copy of your medical records when going on a trip. Know where a hospital is located in the city you are visiting, in case of an emergency.  Most dangerous household products will have pregnancy warnings on their labels. Ask your health care provider about products if you are unsure.  Limit or eliminate your caffeine intake from coffee, tea, sodas, medicines, and chocolate.  Many women continue working through pregnancy. Staying active might help you stay healthier. If you have a question about the safety or the hours you work at your particular job, talk with your health  care provider.  Get informed:  Read books.  Watch videos.  Go to childbirth classes for you and your significant other.  Talk with experienced moms.  Ask your health care provider about childbirth education classes for you and your partner. Classes can help you and your partner prepare for the birth of your baby.  Ask about a baby doctor (pediatrician) and methods and pain medicine for labor, delivery, and possible cesarean delivery. HOW OFTEN SHOULD I SEE MY HEALTH CARE PROVIDER DURING PREGNANCY?  Your health care provider will give you a schedule for your prenatal visits. You will have visits more often as you get closer to the end of your pregnancy. An average pregnancy lasts about 40 weeks.  A typical schedule includes visiting your health care provider:   About once each month during your first 6 months of pregnancy.  Every 2 weeks during the next 2 months.  Weekly in the last month, until the delivery date. Your health care provider will probably want to see you more often if:  You are older than 35 years.  Your pregnancy is high risk because you have certain health problems or problems with the pregnancy, such as:  Diabetes.  High blood pressure.  The baby is not growing on schedule, according to the dates of the pregnancy. Your health care provider will do special tests to make sure you and the baby are not having any serious problems. WHAT HAPPENS DURING PRENATAL VISITS?   At your first prenatal visit, your health care provider will do a physical exam and talk to you about your health history and the health history of your partner and your family. Your health care provider will be able to tell you what date to expect your baby to be born on.  Your first physical exam will include checks of your blood pressure, measurements of your height and weight, and an exam of your pelvic organs. Your health care provider will do a Pap test if you have not had one recently and  will do cultures of your cervix to make sure there is no infection.  At each prenatal visit, there will be tests of your blood, urine, blood pressure, weight, and checking the progress of the baby.  At your later prenatal visits, your health care provider will check how you are doing and how the baby is developing. You may have a number of tests done as your pregnancy progresses.  Ultrasound exams are often used to check on the baby's growth and health.  You may have more urine and blood tests, as well as special tests, if needed. These may include amniocentesis to examine fluid in the pregnancy sac, stress tests to check how the baby responds to contractions, or a biophysical profile to measure fetus well-being. Your health care provider will explain the tests and why they are necessary.  You should discuss with your health care provider your plans to breastfeed or bottle-feed your baby.  Each visit is also a chance for you to learn about staying healthy during pregnancy and to ask questions. Document Released: 03/13/2003 Document Revised: 12/29/2012 Document Reviewed: 08/26/2012 [facility] Patient Information 2014 [place], [place].

## 2013-07-19 NOTE — MAU Note (Signed)
Was told she had a polyp, was told to come here.. No bleeding, pain when she uses the restroom, she is constipated.

## 2013-07-19 NOTE — MAU Provider Note (Signed)
Chief Complaint: No chief complaint on file.  @MAUPATCONTACT @ SUBJECTIVE HPI: Jill [name] is a 43 y.o. G1P0 at [redacted]w[redacted]d by LMP who presents with known cervical polyp, complains of pain and discomfort with each bowel movement, urination, or uses any pelvic pressure. Reported that poylp occasionally will "come out of vagina", not always able to reduce it. Pain is mostly located in/around polyp, worse with it out. States that today her pain is not worse, and symptoms are stable, however there has been no improvement.  Denies vaginal bleeding or LOF  Recent evaluation in MAU on 07/15/13, found to have cervical polyp on exam (not identified on [place]), at that time options discussed regarding potentially removing vs leaving polyp during pregnancy. Pt was instructed to follow up in clinic for further evaluation.   Pregnancy Course: [facility] - not initiated yet, plans to follow-up with [facility] (first [facility] apt scheduled for 07/26/13). - Significant for Twin gestation  Past Medical History  Diagnosis Date  . Medical history non-contributory    OB History  Gravida Para Term Preterm AB SAB TAB Ectopic Multiple Living  1             # Outcome Date GA Lbr Len/2nd Weight Sex Delivery Anes PTL Lv  1 CUR              Past Surgical History  Procedure Laterality Date  . No past surgeries     History   Social History  . Marital Status: Married    Spouse Name: N/A    Number of Children: N/A  . Years of Education: N/A   Occupational History  . Not on file.   Social History Main Topics  . Smoking status: Never Smoker   . Smokeless tobacco: Never Used  . Alcohol Use: No  . Drug Use: No  . Sexual Activity: Not on file   Other Topics Concern  . Not on file   Social History Narrative  . No narrative on file   No current facility-administered medications on file prior to encounter.   Current Outpatient Prescriptions on File Prior to Encounter  Medication Sig Dispense Refill  . doxylamine, Sleep,  (UNISOM) 25 MG tablet Take 25 mg by mouth at bedtime.      . pyridOXINE (VITAMIN B-6) 100 MG tablet Take 100 mg by mouth daily. States she takes half a pill in morning and other half at night.       No Known Allergies  ROS: Pertinent items in HPI  OBJECTIVE Blood pressure 104/58, pulse 85, temperature 98.5 F (36.9 C), temperature source Oral, resp. rate 16, height 5' (1.524 m), weight 48.535 kg (107 lb), last menstrual period 05/19/2013. GENERAL: Well-developed, well-nourished female in no acute distress.  HEART: RRR, no murmurs RESP: normal effort ABDOMEN: Soft, mild generalized lower abdominal tenderness EXTREMITIES: Non-tender, no edema NEURO: Alert and oriented  LAB RESULTS Results for orders placed during the hospital encounter of 07/19/13 (from the past 24 hour(s))  URINALYSIS, ROUTINE W REFLEX MICROSCOPIC     Status: Abnormal   Collection Time    07/19/13  3:55 PM      Result Value Ref Range   Color, Urine STRAW (*) YELLOW   APPearance CLEAR  CLEAR   Specific Gravity, Urine <1.005 (*) 1.005 - 1.030   pH 6.0  5.0 - 8.0   Glucose, UA NEGATIVE  NEGATIVE mg/dL   Hgb urine dipstick NEGATIVE  NEGATIVE   Bilirubin Urine NEGATIVE  NEGATIVE   Ketones, ur NEGATIVE  NEGATIVE mg/dL   Protein, ur NEGATIVE  NEGATIVE mg/dL   Urobilinogen, UA 0.2  0.0 - 1.0 mg/dL   Nitrite NEGATIVE  NEGATIVE   Leukocytes, UA TRACE (*) NEGATIVE  URINE MICROSCOPIC-ADD ON     Status: Abnormal   Collection Time    07/19/13  3:55 PM      Result Value Ref Range   Squamous Epithelial / LPF RARE  RARE   WBC, UA 0-2  <3 WBC/hpf   RBC / HPF 0-2  <3 RBC/hpf   Bacteria, UA FEW (*) RARE    IMAGING [place] Ob Comp Less 14 Wks  07/16/2013   CLINICAL DATA:  Twin pregnancy  EXAM: TWIN OBSTETRICAL ULTRASOUND <14 WKS  COMPARISON:  None.  FINDINGS: TWIN 1  Intrauterine gestational sac: Visualized/normal in shape.  Yolk sac:  Present  Embryo:  Present  Cardiac Activity: Present  Heart Rate: 176 bpm  CRL:   20.4  mm   8  w 5 d                  US EDC: 02/19/2014  TWIN 2  Intrauterine gestational sac: Visualized/normal in shape.  Yolk sac:  Present  Embryo:  Present  Cardiac Activity: Present  Heart Rate: 179 bpm  CRL:   18.4  mm   8 w 3 d                  US EDC: 02/21/2014  Maternal uterus/adnexae:  Single gestational sac with dividing membrane.  Small subchorionic hemorrhage.  Uterus otherwise normal in appearance.  No free pelvic fluid.  Cervical polyp noted on physical exam is not sonographically evident.  IMPRESSION: Live twin intrauterine gestations as above.  Small subchorionic hemorrhage.   Electronically Signed   By: Ulyses SouthwardMark  Boles M.D.   On: 07/16/2013 01:23   [place] Ob Comp Less 14 Wks  06/24/2013   CLINICAL DATA:  Pain.  EXAM: OBSTETRIC <14 WK US AND TRANSVAGINAL OB US  TECHNIQUE: Both transabdominal and transvaginal ultrasound examinations were performed for complete evaluation of the gestation as well as the maternal uterus, adnexal regions, and pelvic cul-de-sac. Transvaginal technique was performed to assess early pregnancy.  COMPARISON:  None.  FINDINGS: Intrauterine gestational sac: Visualized/normal in shape.  Yolk sac:  Yes.  Embryo:  None identified.  MSD:  8.8   mm   5 w   4  d  US EDC: 02/20/2014.  Maternal uterus/adnexae: 1.7 x 1.0 x 1.1 cm cyst, most likely corpus luteal cyst, right ovary.  No free pelvic fluid.  IMPRESSION: Findings most consistent with early 5 week 4 day intrauterine pregnancy. No fetal pole is identified at this time, a process such as ectopic pregnancy cannot be completely excluded. Follow-up pelvic ultrasound and follow-up beta HCG suggested for continued evaluation.   Electronically Signed   By: Maisie Fushomas  Register   On: 06/24/2013 19:50   [place] Ob Comp Addl Gest Less 14 Wks  07/16/2013   CLINICAL DATA:  Twin pregnancy  EXAM: TWIN OBSTETRICAL ULTRASOUND <14 WKS  COMPARISON:  None.  FINDINGS: TWIN 1  Intrauterine gestational sac: Visualized/normal in shape.  Yolk sac:  Present  Embryo:  Present   Cardiac Activity: Present  Heart Rate: 176 bpm  CRL:   20.4  mm   8 w 5 d                  US EDC: 02/19/2014  TWIN 2  Intrauterine gestational sac: Visualized/normal in shape.  Yolk sac:  Present  Embryo:  Present  Cardiac Activity: Present  Heart Rate: 179 bpm  CRL:   18.4  mm   8 w 3 d                  US EDC: 02/21/2014  Maternal uterus/adnexae:  Single gestational sac with dividing membrane.  Small subchorionic hemorrhage.  Uterus otherwise normal in appearance.  No free pelvic fluid.  Cervical polyp noted on physical exam is not sonographically evident.  IMPRESSION: Live twin intrauterine gestations as above.  Small subchorionic hemorrhage.   Electronically Signed   By: Ulyses SouthwardMark  Boles M.D.   On: 07/16/2013 01:23   [place] Ob Transvaginal  06/24/2013   CLINICAL DATA:  Pain.  EXAM: OBSTETRIC <14 WK US AND TRANSVAGINAL OB US  TECHNIQUE: Both transabdominal and transvaginal ultrasound examinations were performed for complete evaluation of the gestation as well as the maternal uterus, adnexal regions, and pelvic cul-de-sac. Transvaginal technique was performed to assess early pregnancy.  COMPARISON:  None.  FINDINGS: Intrauterine gestational sac: Visualized/normal in shape.  Yolk sac:  Yes.  Embryo:  None identified.  MSD:  8.8   mm   5 w   4  d  US EDC: 02/20/2014.  Maternal uterus/adnexae: 1.7 x 1.0 x 1.1 cm cyst, most likely corpus luteal cyst, right ovary.  No free pelvic fluid.  IMPRESSION: Findings most consistent with early 5 week 4 day intrauterine pregnancy. No fetal pole is identified at this time, a process such as ectopic pregnancy cannot be completely excluded. Follow-up pelvic ultrasound and follow-up beta HCG suggested for continued evaluation.   Electronically Signed   By: Maisie Fushomas  Register   On: 06/24/2013 19:50    MAU COURSE  ASSESSMENT No diagnosis found.  West PughKhujista Cammarano is a 43 y.o. G1P0 at 3936w5d who presented to MAU for re-evaluation of known cervical polyp, complaints of lower abdominal  pain and local polyp pain with straining, pain worse with polyp outside vagina (often reducible). Pain and symptoms stable from prior, no change today. [facility]  PLAN - Received Miralax x1 dose in MAU, recommended stool softeners (metamucil, miralax) PRN to assist with BMs - Advised that we are likely unable to re-schedule current apt to a sooner date. Currently 07/26/13 - Discharge home    Medication List    ASK your doctor about these medications       acetaminophen 325 MG tablet  Commonly known as:  TYLENOL  Take by mouth every 6 (six) hours as needed for headache.     doxylamine (Sleep) 25 MG tablet  Commonly known as:  UNISOM  Take 25 mg by mouth at bedtime.     pyridOXINE 100 MG tablet  Commonly known as:  VITAMIN B-6  Take 100 mg by mouth daily. States she takes half a pill in morning and other half at night.       Saralyn PilarAlexander Karamalegos, DO [facility], PGY-1 07/19/2013  5:57 PM  Evaluation and management procedures were performed by the resident under my supervision and collaboration. I have reviewed the note and chart, and I agree with the management and plan.  Iona HansenJennifer Irene Rasch, NP 07/19/2013 6:59 PM

## 2013-07-19 NOTE — Progress Notes (Signed)
Written and verbal d/c instructions given and understanding voiced. 

## 2013-07-26 ENCOUNTER — Ambulatory Visit (INDEPENDENT_AMBULATORY_CARE_PROVIDER_SITE_OTHER): Payer: Medicaid Other | Admitting: Family Medicine

## 2013-07-26 ENCOUNTER — Other Ambulatory Visit: Payer: Self-pay | Admitting: Family Medicine

## 2013-07-26 ENCOUNTER — Encounter: Payer: Self-pay | Admitting: Family Medicine

## 2013-07-26 VITALS — BP 110/68 | HR 93 | Temp 97.9°F | Wt 105.1 lb

## 2013-07-26 DIAGNOSIS — O09529 Supervision of elderly multigravida, unspecified trimester: Secondary | ICD-10-CM

## 2013-07-26 DIAGNOSIS — O3441 Maternal care for other abnormalities of cervix, first trimester: Principal | ICD-10-CM

## 2013-07-26 DIAGNOSIS — IMO0001 Reserved for inherently not codable concepts without codable children: Secondary | ICD-10-CM

## 2013-07-26 DIAGNOSIS — O30009 Twin pregnancy, unspecified number of placenta and unspecified number of amniotic sacs, unspecified trimester: Secondary | ICD-10-CM

## 2013-07-26 DIAGNOSIS — Z3682 Encounter for antenatal screening for nuchal translucency: Secondary | ICD-10-CM

## 2013-07-26 DIAGNOSIS — N841 Polyp of cervix uteri: Secondary | ICD-10-CM

## 2013-07-26 DIAGNOSIS — O3440 Maternal care for other abnormalities of cervix, unspecified trimester: Secondary | ICD-10-CM

## 2013-07-26 DIAGNOSIS — O30001 Twin pregnancy, unspecified number of placenta and unspecified number of amniotic sacs, first trimester: Secondary | ICD-10-CM

## 2013-07-26 DIAGNOSIS — N942 Vaginismus: Secondary | ICD-10-CM | POA: Insufficient documentation

## 2013-07-26 DIAGNOSIS — IMO0002 Reserved for concepts with insufficient information to code with codable children: Secondary | ICD-10-CM

## 2013-07-26 LAB — POCT URINALYSIS DIP (DEVICE)
BILIRUBIN URINE: NEGATIVE
Glucose, UA: NEGATIVE mg/dL
Nitrite: NEGATIVE
Protein, ur: NEGATIVE mg/dL
SPECIFIC GRAVITY, URINE: 1.015 (ref 1.005–1.030)
Urobilinogen, UA: 0.2 mg/dL (ref 0.0–1.0)
pH: 5.5 (ref 5.0–8.0)

## 2013-07-26 NOTE — Progress Notes (Signed)
Occasional lower abdominal/pelvic pain.  Yellow discharge with slight odor; denies itching, burning or irritation.  Initial labs and early 1hr gtt to be drawn today; labs to be drawn at 1442. Pap today.
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
## 2013-07-26 NOTE — Patient Instructions (Signed)
Pregnancy - First Trimester  During sexual intercourse, millions of sperm go into the vagina. Only 1 sperm will penetrate and fertilize the female egg while it is in the Fallopian tube. One week later, the fertilized egg implants into the wall of the uterus. An embryo begins to develop into a baby. At 6 to 8 weeks, the eyes and face are formed and the heartbeat can be seen on ultrasound. At the end of 12 weeks (first trimester), all the baby's organs are formed. Now that you are pregnant, you will want to do everything you can to have a healthy baby. Two of the most important things are to get good prenatal care and follow your caregiver's instructions. Prenatal care is all the medical care you receive before the baby's birth. It is given to prevent, find, and treat problems during the pregnancy and childbirth.  PRENATAL EXAMS  · During prenatal visits, your weight, blood pressure, and urine are checked. This is done to make sure you are healthy and progressing normally during the pregnancy.  · A pregnant woman should gain 25 to 35 pounds during the pregnancy. However, if you are overweight or underweight, your caregiver will advise you regarding your weight.  · Your caregiver will ask and answer questions for you.  · Blood work, cervical cultures, other necessary tests, and a Pap test are done during your prenatal exams. These tests are done to check on your health and the probable health of your baby. Tests are strongly recommended and done for HIV with your permission. This is the virus that causes AIDS. These tests are done because medicines can be given to help prevent your baby from being born with this infection should you have been infected without knowing it. Blood work is also used to find out your blood type, previous infections, and follow your blood levels (hemoglobin).  · Low hemoglobin (anemia) is common during pregnancy. Iron and vitamins are given to help prevent this. Later in the pregnancy, blood  tests for diabetes will be done along with any other tests if any problems develop.  · You may need other tests to make sure you and the baby are doing well.  CHANGES DURING THE FIRST TRIMESTER   Your body goes through many changes during pregnancy. They vary from person to person. Talk to your caregiver about changes you notice and are concerned about. Changes can include:  · Your menstrual period stops.  · The egg and sperm carry the genes that determine what you look like. Genes from you and your partner are forming a baby. The female genes determine whether the baby is a boy or a girl.  · Your body increases in girth and you may feel bloated.  · Feeling sick to your stomach (nauseous) and throwing up (vomiting). If the vomiting is uncontrollable, call your caregiver.  · Your breasts will begin to enlarge and become tender.  · Your nipples may stick out more and become darker.  · The need to urinate more. Painful urination may mean you have a bladder infection.  · Tiring easily.  · Loss of appetite.  · Cravings for certain kinds of food.  · At first, you may gain or lose a couple of pounds.  · You may have changes in your emotions from day to day (excited to be pregnant or concerned something may go wrong with the pregnancy and baby).  · You may have more vivid and strange dreams.  HOME CARE INSTRUCTIONS   ·   It is very important to avoid all smoking, alcohol and non-prescribed drugs during your pregnancy. These affect the formation and growth of the baby. Avoid chemicals while pregnant to ensure the delivery of a healthy infant.  · Start your prenatal visits by the 12th week of pregnancy. They are usually scheduled monthly at first, then more often in the last 2 months before delivery. Keep your caregiver's appointments. Follow your caregiver's instructions regarding medicine use, blood and lab tests, exercise, and diet.  · During pregnancy, you are providing food for you and your baby. Eat regular, well-balanced  meals. Choose foods such as meat, fish, milk and other low fat dairy products, vegetables, fruits, and whole-grain breads and cereals. Your caregiver will tell you of the ideal weight gain.  · You can help morning sickness by keeping soda crackers at the bedside. Eat a couple before arising in the morning. You may want to use the crackers without salt on them.  · Eating 4 to 5 small meals rather than 3 large meals a day also may help the nausea and vomiting.  · Drinking liquids between meals instead of during meals also seems to help nausea and vomiting.  · A physical sexual relationship may be continued throughout pregnancy if there are no other problems. Problems may be early (premature) leaking of amniotic fluid from the membranes, vaginal bleeding, or belly (abdominal) pain.  · Exercise regularly if there are no restrictions. Check with your caregiver or physical therapist if you are unsure of the safety of some of your exercises. Greater weight gain will occur in the last 2 trimesters of pregnancy. Exercising will help:  · Control your weight.  · Keep you in shape.  · Prepare you for labor and delivery.  · Help you lose your pregnancy weight after you deliver your baby.  · Wear a good support or jogging bra for breast tenderness during pregnancy. This may help if worn during sleep too.  · Ask when prenatal classes are available. Begin classes when they are offered.  · Do not use hot tubs, steam rooms, or saunas.  · Wear your seat belt when driving. This protects you and your baby if you are in an accident.  · Avoid raw meat, uncooked cheese, cat litter boxes, and soil used by cats throughout the pregnancy. These carry germs that can cause birth defects in the baby.  · The first trimester is a good time to visit your dentist for your dental health. Getting your teeth cleaned is okay. Use a softer toothbrush and brush gently during pregnancy.  · Ask for help if you have financial, counseling, or nutritional needs  during pregnancy. Your caregiver will be able to offer counseling for these needs as well as refer you for other special needs.  · Do not take any medicines or herbs unless told by your caregiver.  · Inform your caregiver if there is any mental or physical domestic violence.  · Make a list of emergency phone numbers of family, friends, hospital, and police and fire departments.  · Write down your questions. Take them to your prenatal visit.  · Do not douche.  · Do not cross your legs.  · If you have to stand for long periods of time, rotate you feet or take small steps in a circle.  · You may have more vaginal secretions that may require a sanitary pad. Do not use tampons or scented sanitary pads.  MEDICINES AND DRUG USE IN PREGNANCY  ·   Take prenatal vitamins as directed. The vitamin should contain 1 milligram of folic acid. Keep all vitamins out of reach of children. Only a couple vitamins or tablets containing iron may be fatal to a baby or young child when ingested.  · Avoid use of all medicines, including herbs, over-the-counter medicines, not prescribed or suggested by your caregiver. Only take over-the-counter or prescription medicines for pain, discomfort, or fever as directed by your caregiver. Do not use aspirin, ibuprofen, or naproxen unless directed by your caregiver.  · Let your caregiver also know about herbs you may be using.  · Alcohol is related to a number of birth defects. This includes fetal alcohol syndrome. All alcohol, in any form, should be avoided completely. Smoking will cause low birth rate and premature babies.  · Street or illegal drugs are very harmful to the baby. They are absolutely forbidden. A baby born to an addicted mother will be addicted at birth. The baby will go through the same withdrawal an adult does.  · Let your caregiver know about any medicines that you have to take and for what reason you take them.  SEEK MEDICAL CARE IF:   You have any concerns or worries during your  pregnancy. It is better to call with your questions if you feel they cannot wait, rather than worry about them.  SEEK IMMEDIATE MEDICAL CARE IF:   · An unexplained oral temperature above 102° F (38.9° C) develops, or as your caregiver suggests.  · You have leaking of fluid from the vagina (birth canal). If leaking membranes are suspected, take your temperature and inform your caregiver of this when you call.  · There is vaginal spotting or bleeding. Notify your caregiver of the amount and how many pads are used.  · You develop a bad smelling vaginal discharge with a change in the color.  · You continue to feel sick to your stomach (nauseated) and have no relief from remedies suggested. You vomit blood or coffee ground-like materials.  · You lose more than 2 pounds of weight in 1 week.  · You gain more than 2 pounds of weight in 1 week and you notice swelling of your face, hands, feet, or legs.  · You gain 5 pounds or more in 1 week (even if you do not have swelling of your hands, face, legs, or feet).  · You get exposed to German measles and have never had them.  · You are exposed to fifth disease or chickenpox.  · You develop belly (abdominal) pain. Round ligament discomfort is a common non-cancerous (benign) cause of abdominal pain in pregnancy. Your caregiver still must evaluate this.  · You develop headache, fever, diarrhea, pain with urination, or shortness of breath.  · You fall or are in a car accident or have any kind of trauma.  · There is mental or physical violence in your home.  Document Released: 03/04/2001 Document Revised: 12/03/2011 Document Reviewed: 09/05/2008  [employer]® Patient Information ©2014 [employer].

## 2013-07-27 ENCOUNTER — Encounter: Payer: Self-pay | Admitting: Family Medicine

## 2013-07-27 LAB — HEPATITIS B SURFACE ANTIGEN: Hepatitis B Surface Ag: NEGATIVE

## 2013-07-27 LAB — HIV ANTIBODY (ROUTINE TESTING W REFLEX): HIV: NONREACTIVE

## 2013-07-27 LAB — GLUCOSE TOLERANCE, 1 HOUR (50G) W/O FASTING: Glucose, 1 Hour GTT: 102 mg/dL (ref 70–140)

## 2013-07-27 LAB — RPR

## 2013-07-27 LAB — ANTIBODY SCREEN: ANTIBODY SCREEN: NEGATIVE

## 2013-07-28 LAB — PRESCRIPTION MONITORING PROFILE (19 PANEL)
Amphetamine/Meth: NEGATIVE ng/mL
BENZODIAZEPINE SCREEN, URINE: NEGATIVE ng/mL
BUPRENORPHINE, URINE: NEGATIVE ng/mL
Barbiturate Screen, Urine: NEGATIVE ng/mL
COCAINE METABOLITES: NEGATIVE ng/mL
Cannabinoid Scrn, Ur: NEGATIVE ng/mL
Carisoprodol, Urine: NEGATIVE ng/mL
Creatinine, Urine: 160.97 mg/dL (ref >20.0)
FENTANYL URINE: NEGATIVE ng/mL
MDMA URINE: NEGATIVE ng/mL
Meperidine, Ur: NEGATIVE ng/mL
Methadone Screen, Urine: NEGATIVE ng/mL
Methaqualone: NEGATIVE ng/mL
Nitrites, Initial: NEGATIVE ug/mL
OPIATE SCREEN, URINE: NEGATIVE ng/mL
Oxycodone Screen, Ur: NEGATIVE ng/mL
PROPOXYPHENE: NEGATIVE ng/mL
Phencyclidine, Ur: NEGATIVE ng/mL
Tapentadol, urine: NEGATIVE ng/mL
Tramadol Scrn, Ur: NEGATIVE ng/mL
ZOLPIDEM, URINE: NEGATIVE ng/mL
pH, Initial: 5.9 pH (ref 4.5–8.9)

## 2013-07-28 LAB — HEMOGLOBINOPATHY EVALUATION
HGB F QUANT: 0 % (ref 0.0–2.0)
HGB S QUANTITAION: 0 %
Hemoglobin Other: 0 %
Hgb A2 Quant: 3 % (ref 2.2–3.2)
Hgb A: 97 % (ref 96.8–97.8)

## 2013-07-28 LAB — CULTURE, OB URINE: Colony Count: 2000

## 2013-07-29 LAB — RUBELLA ANTIBODY, IGM: Rubella IgM: 0.29 (ref <0.90)

## 2013-08-11 ENCOUNTER — Encounter (HOSPITAL_COMMUNITY): Payer: Self-pay | Admitting: *Deleted

## 2013-08-11 ENCOUNTER — Inpatient Hospital Stay (HOSPITAL_COMMUNITY)
Admission: AD | Admit: 2013-08-11 | Discharge: 2013-08-11 | Disposition: A | Discharge status: Home / Self Care | Payer: Medicaid Other | Source: Ambulatory Visit | Attending: Obstetrics & Gynecology | Admitting: Obstetrics & Gynecology

## 2013-08-11 DIAGNOSIS — O210 Mild hyperemesis gravidarum: Secondary | ICD-10-CM | POA: Insufficient documentation

## 2013-08-11 DIAGNOSIS — IMO0002 Reserved for concepts with insufficient information to code with codable children: Secondary | ICD-10-CM

## 2013-08-11 DIAGNOSIS — O219 Vomiting of pregnancy, unspecified: Secondary | ICD-10-CM

## 2013-08-11 DIAGNOSIS — O09529 Supervision of elderly multigravida, unspecified trimester: Secondary | ICD-10-CM

## 2013-08-11 DIAGNOSIS — O09519 Supervision of elderly primigravida, unspecified trimester: Secondary | ICD-10-CM | POA: Insufficient documentation

## 2013-08-11 DIAGNOSIS — O30009 Twin pregnancy, unspecified number of placenta and unspecified number of amniotic sacs, unspecified trimester: Secondary | ICD-10-CM | POA: Insufficient documentation

## 2013-08-11 DIAGNOSIS — O30001 Twin pregnancy, unspecified number of placenta and unspecified number of amniotic sacs, first trimester: Secondary | ICD-10-CM

## 2013-08-11 LAB — URINALYSIS, ROUTINE W REFLEX MICROSCOPIC
Bilirubin Urine: NEGATIVE
Glucose, UA: NEGATIVE mg/dL
Ketones, ur: NEGATIVE mg/dL
NITRITE: NEGATIVE
PH: 6.5 (ref 5.0–8.0)
Protein, ur: NEGATIVE mg/dL
Urobilinogen, UA: 0.2 mg/dL (ref 0.0–1.0)

## 2013-08-11 LAB — URINE MICROSCOPIC-ADD ON

## 2013-08-11 MED ORDER — PROMETHAZINE HCL 25 MG PO TABS: 12.5000 mg | ORAL_TABLET | Freq: Four times a day (QID) | ORAL | Status: DC | PRN

## 2013-08-11 MED ORDER — PROMETHAZINE HCL 25 MG PO TABS: 25.0000 mg | ORAL_TABLET | Freq: Once | ORAL | Status: DC

## 2013-08-11 MED ORDER — PROMETHAZINE HCL 25 MG PO TABS
12.5000 mg | ORAL_TABLET | Freq: Once | ORAL | Status: AC
  Administered 2013-08-11: 12.5 mg via ORAL
  Filled 2013-08-11: qty 1

## 2013-08-11 NOTE — Discharge Instructions (Signed)
Second Trimester of Pregnancy The second trimester is from week 13 through week 28, months 4 through 6. The second trimester is often a time when you feel your best. Your body has also adjusted to being pregnant, and you begin to feel better physically. Usually, morning sickness has lessened or quit completely, you may have more energy, and you may have an increase in appetite. The second trimester is also a time when the fetus is growing rapidly. At the end of the sixth month, the fetus is about 9 inches long and weighs about 1 pounds. You will likely begin to feel the baby move (quickening) between 18 and 20 weeks of the pregnancy. BODY CHANGES Your body goes through many changes during pregnancy. The changes vary from woman to woman.   Your weight will continue to increase. You will notice your lower abdomen bulging out.  You may begin to get stretch marks on your hips, abdomen, and breasts.  You may develop headaches that can be relieved by medicines approved by your caregiver.  You may urinate more often because the fetus is pressing on your bladder.  You may develop or continue to have heartburn as a result of your pregnancy.  You may develop constipation because certain hormones are causing the muscles that push waste through your intestines to slow down.  You may develop hemorrhoids or swollen, bulging veins (varicose veins).  You may have back pain because of the weight gain and pregnancy hormones relaxing your joints between the bones in your pelvis and as a result of a shift in weight and the muscles that support your balance.  Your breasts will continue to grow and be tender.  Your gums may bleed and may be sensitive to brushing and flossing.  Dark spots or blotches (chloasma, mask of pregnancy) may develop on your face. This will likely fade after the baby is born.  A dark line from your belly button to the pubic area (linea nigra) may appear. This will likely fade after the  baby is born. WHAT TO EXPECT AT YOUR PRENATAL VISITS During a routine prenatal visit:  You will be weighed to make sure you and the fetus are growing normally.  Your blood pressure will be taken.  Your abdomen will be measured to track your baby's growth.  The fetal heartbeat will be listened to.  Any test results from the previous visit will be discussed. Your caregiver may ask you:  How you are feeling.  If you are feeling the baby move.  If you have had any abnormal symptoms, such as leaking fluid, bleeding, severe headaches, or abdominal cramping.  If you have any questions. Other tests that may be performed during your second trimester include:  Blood tests that check for:  Low iron levels (anemia).  Gestational diabetes (between 24 and 28 weeks).  Rh antibodies.  Urine tests to check for infections, diabetes, or protein in the urine.  An ultrasound to confirm the proper growth and development of the baby.  An amniocentesis to check for possible genetic problems.  Fetal screens for spina bifida and Down syndrome. HOME CARE INSTRUCTIONS   Avoid all smoking, herbs, alcohol, and unprescribed drugs. These chemicals affect the formation and growth of the baby.  Follow your caregiver's instructions regarding medicine use. There are medicines that are either safe or unsafe to take during pregnancy.  Exercise only as directed by your caregiver. Experiencing uterine cramps is a good sign to stop exercising.  Continue to eat regular,   healthy meals.  Wear a good support bra for breast tenderness.  Do not use hot tubs, steam rooms, or saunas.  Wear your seat belt at all times when driving.  Avoid raw meat, uncooked cheese, cat litter boxes, and soil used by cats. These carry germs that can cause birth defects in the baby.  Take your prenatal vitamins.  Try taking a stool softener (if your caregiver approves) if you develop constipation. Eat more high-fiber foods,  such as fresh vegetables or fruit and whole grains. Drink plenty of fluids to keep your urine clear or pale yellow.  Take warm sitz baths to soothe any pain or discomfort caused by hemorrhoids. Use hemorrhoid cream if your caregiver approves.  If you develop varicose veins, wear support hose. Elevate your feet for 15 minutes, 3 4 times a day. Limit salt in your diet.  Avoid heavy lifting, wear low heel shoes, and practice good posture.  Rest with your legs elevated if you have leg cramps or low back pain.  Visit your dentist if you have not gone yet during your pregnancy. Use a soft toothbrush to brush your teeth and be gentle when you floss.  A sexual relationship may be continued unless your caregiver directs you otherwise.  Continue to go to all your prenatal visits as directed by your caregiver. SEEK MEDICAL CARE IF:   You have dizziness.  You have mild pelvic cramps, pelvic pressure, or nagging pain in the abdominal area.  You have persistent nausea, vomiting, or diarrhea.  You have a bad smelling vaginal discharge.  You have pain with urination. SEEK IMMEDIATE MEDICAL CARE IF:   You have a fever.  You are leaking fluid from your vagina.  You have spotting or bleeding from your vagina.  You have severe abdominal cramping or pain.  You have rapid weight gain or loss.  You have shortness of breath with chest pain.  You notice sudden or extreme swelling of your face, hands, ankles, feet, or legs.  You have not felt your baby move in over an hour.  You have severe headaches that do not go away with medicine.  You have vision changes. Document Released: 03/04/2001 Document Revised: 11/10/2012 Document Reviewed: 05/11/2012 [employer] Patient Information 2014 [employer].  

## 2013-08-11 NOTE — MAU Provider Note (Signed)
History     CSN: [id number]  Arrival date and time: 08/11/13 1329   First Provider Initiated Contact with Patient 08/11/13 1421      Chief Complaint  Patient presents with  . Fatigue   HPI  Jill [name] is a 43 y.o. G1P0 at 3282w0d who presents today with nausea. She states that on Tuesday she had a couple of episodes of emesis and yesterday she was dry heaving. She has not vomited today, but she is nausea. She also has very little appetite. She has been taking diclegis, and it helps most of the time. She denies nay vaginal bleeding or abdominal pain.   Past Medical History  Diagnosis Date  . Medical history non-contributory   . Polyp of cervix     Past Surgical History  Procedure Laterality Date  . No past surgeries      Family History  Problem Relation Age of Onset  . Diabetes Mother   . Heart disease Mother     History  Substance Use Topics  . Smoking status: Never Smoker   . Smokeless tobacco: Never Used  . Alcohol Use: No    Allergies: No Known Allergies  Prescriptions prior to admission  Medication Sig Dispense Refill  . acetaminophen (TYLENOL) 325 MG tablet Take by mouth every 6 (six) hours as needed for headache.      . doxylamine, Sleep, (UNISOM) 25 MG tablet Take 25 mg by mouth at bedtime.      . pyridOXINE (VITAMIN B-6) 100 MG tablet Take 100 mg by mouth daily. States she takes half a pill in morning and other half at night.        ROS Physical Exam   Blood pressure 104/62, pulse 79, temperature 98.5 F (36.9 C), resp. rate 16, height 4\' 11"  (1.499 m), weight 47.628 kg (105 lb), last menstrual period 05/19/2013.  Physical Exam  Nursing note and vitals reviewed. Constitutional: She is oriented to person, place, and time. She appears well-developed and well-nourished. No distress.  Cardiovascular: Normal rate.   Respiratory: Effort normal.  GI: Soft. There is no tenderness.  Genitourinary:   FHT 160 and 172   Neurological: She is alert and  oriented to person, place, and time.  Skin: Skin is warm and dry.  Psychiatric: She has a normal mood and affect.    MAU Course  Procedures  Results for orders placed during the hospital encounter of 08/11/13 (from the past 24 hour(s))  URINALYSIS, ROUTINE W REFLEX MICROSCOPIC     Status: Abnormal   Collection Time    08/11/13  1:45 PM      Result Value Ref Range   Color, Urine STRAW (*) YELLOW   APPearance CLEAR  CLEAR   Specific Gravity, Urine <1.005 (*) 1.005 - 1.030   pH 6.5  5.0 - 8.0   Glucose, UA NEGATIVE  NEGATIVE mg/dL   Hgb urine dipstick TRACE (*) NEGATIVE   Bilirubin Urine NEGATIVE  NEGATIVE   Ketones, ur NEGATIVE  NEGATIVE mg/dL   Protein, ur NEGATIVE  NEGATIVE mg/dL   Urobilinogen, UA 0.2  0.0 - 1.0 mg/dL   Nitrite NEGATIVE  NEGATIVE   Leukocytes, UA TRACE (*) NEGATIVE  URINE MICROSCOPIC-ADD ON     Status: None   Collection Time    08/11/13  1:45 PM      Result Value Ref Range   Squamous Epithelial / LPF RARE  RARE   WBC, UA 0-2  <3 WBC/hpf   RBC / HPF 0-2  <  3 RBC/hpf   Bacteria, UA RARE  RARE     Assessment and Plan   1. Advanced maternal age, antepartum condition or complication   2. Nausea/vomiting in pregnancy   3. Twin pregnancy, twins concordant in first trimester    Second trimester precautions reviewed Return to MAU as needed     Medication List         acetaminophen 325 MG tablet  Commonly known as:  TYLENOL  Take by mouth every 6 (six) hours as needed for headache.     doxylamine (Sleep) 25 MG tablet  Commonly known as:  UNISOM  Take 25 mg by mouth at bedtime.     promethazine 25 MG tablet  Commonly known as:  PHENERGAN  Take 0.5 tablets (12.5 mg total) by mouth every 6 (six) hours as needed for nausea or vomiting.     pyridOXINE 100 MG tablet  Commonly known as:  VITAMIN B-6  Take 100 mg by mouth daily. States she takes half a pill in morning and other half at night.       Follow-up Information   [facility] (As scheduled)    Specialty:  Obstetrics and Gynecology   Contact information:   [address] [place] [id number] [phone]       Tawnya CrookHeather Donovan Reginaldo Hazard 08/11/2013, 2:30 PM

## 2013-08-11 NOTE — MAU Note (Signed)
Non english speaking pt, spouse interp for pt.  Pt was told by phone to come be evaluated in MAU for possible dehydration in pregnancy.  Pt has been having vomiting with the last time being yesterday.  No abnormal vaginal discharge, bleeding, or leaking of fluid.  Pt denies pain.

## 2013-08-11 NOTE — MAU Note (Signed)
Feeling weak, no appetite, ? Dehydration- last vomited day before yesterdat.  occ pain- none today.

## 2013-08-16 ENCOUNTER — Other Ambulatory Visit: Payer: Self-pay | Admitting: Family Medicine

## 2013-08-16 ENCOUNTER — Encounter: Payer: Self-pay | Admitting: Family Medicine

## 2013-08-16 ENCOUNTER — Ambulatory Visit (HOSPITAL_COMMUNITY)
Admission: RE | Admit: 2013-08-16 | Discharge: 2013-08-16 | Disposition: A | Discharge status: Home / Self Care | Payer: Medicaid Other | Source: Ambulatory Visit | Attending: Family Medicine | Admitting: Family Medicine

## 2013-08-16 ENCOUNTER — Ambulatory Visit (HOSPITAL_COMMUNITY): Admission: RE | Admit: 2013-08-16 | Payer: Medicaid Other | Source: Ambulatory Visit

## 2013-08-16 VITALS — BP 107/60 | HR 90

## 2013-08-16 DIAGNOSIS — O09519 Supervision of elderly primigravida, unspecified trimester: Secondary | ICD-10-CM | POA: Insufficient documentation

## 2013-08-16 DIAGNOSIS — IMO0001 Reserved for inherently not codable concepts without codable children: Secondary | ICD-10-CM

## 2013-08-16 DIAGNOSIS — Z3682 Encounter for antenatal screening for nuchal translucency: Secondary | ICD-10-CM

## 2013-08-16 DIAGNOSIS — O30009 Twin pregnancy, unspecified number of placenta and unspecified number of amniotic sacs, unspecified trimester: Secondary | ICD-10-CM | POA: Insufficient documentation

## 2013-08-16 DIAGNOSIS — O09529 Supervision of elderly multigravida, unspecified trimester: Secondary | ICD-10-CM

## 2013-08-16 DIAGNOSIS — O30039 Twin pregnancy, monochorionic/diamniotic, unspecified trimester: Secondary | ICD-10-CM | POA: Insufficient documentation

## 2013-08-16 DIAGNOSIS — IMO0002 Reserved for concepts with insufficient information to code with codable children: Secondary | ICD-10-CM | POA: Insufficient documentation

## 2013-08-16 NOTE — Progress Notes (Signed)
Genetic Counseling  High-Risk Gestation Note  Appointment Date:  08/16/2013 Referred By: Jill [name], Jill L, MD Date of Birth:  09/12/1970   [name] History: G1P0 Estimated Date of Delivery: 02/23/14 Estimated Gestational Age: 6042w5d Attending: Eulis FosterKristen Quinn, MD   Jill [name] and her partner were seen for genetic counseling because of a maternal age of 43 y.o..  Farsi/English interpretation provided by Jill [name], Research officer, political partyazreen.   They were counseled regarding maternal age and the association with risk for chromosome conditions due to nondisjunction with aging of the ova.   We reviewed chromosomes, nondisjunction, and the associated 1 in 22 risk for fetal aneuploidy related to a maternal age of 43 y.o. at 8942w5d gestation.  We reviewed that the current [name] is a monochorionic/diamniotic twin gestation, by today's ultrasound. They were counseled that the risk for aneuploidy decreases as gestational age increases, accounting for those pregnancies which spontaneously abort.  We specifically discussed Down syndrome (trisomy 6921), trisomies 213 and 1518, and sex chromosome aneuploidies (47,XXX and 47,XXY) including the common features and prognoses of each.   We reviewed available screening options including First Screen, Quad screen, noninvasive prenatal screening (NIPS)/cell free DNA (cfDNA) testing, and detailed ultrasound.  They were counseled that screening tests are used to modify a patient's a priori risk for aneuploidy, typically based on age. This estimate provides a [name] specific risk assessment. We reviewed the benefits and limitations of each option. Overall, screening tests are less sensitive and less specific in a twin gestation.  We discussed the conditions for which each test screens, the detection rates, and false positive rates of each. They were also counseled regarding diagnostic testing via CVS and amniocentesis. We reviewed the approximate 1 in 100 risk for  complications for CVS and the approximate 1 in 300-500 risk for complications for amniocentesis, including spontaneous Jill [name] for both. After consideration of all the options, they declined First screen, NIPS, Quad screen, CVS, and amniocentesis at this time. The Jill [name] stated that he and Jill [name] needed to further consider these options, though he stated that he felt there was value in obtaining information to psychologically prepare. They will further consider screening options and let Jill [name] know if they would like to pursue them during the [name]. We reviewed the time sensitive nature of some of these screens, but reviewed that NIPS does not have a gestational age limit.   Nuchal translucency ultrasound was performed today.  The report will be documented separately.  Follow-up ultrasound was scheduled for 09/06/13.  They understand that screening tests cannot rule out all birth defects or genetic syndromes.   Both family histories were reviewed and found to be contributory for intellectual disability for the patient's female paternal first cousin (her paternal uncle's son). She reported that he is mentally "a little off." It was unclear if this was in reference to cognitive delays or a mental health condition.  He is reportedly married and otherwise healthy. A specific etiology was not known for his features. Jill [name] was counseled that there are many different causes of intellectual disabilities including environmental, multifactorial, and genetic etiologies.  We discussed that a specific diagnosis for intellectual disability can be determined in approximately 50% of these individuals.  In the remaining 50% of individuals, a diagnosis may never be determined.  Regarding genetic causes, chromosome aberrations (aneuploidy, deletions, duplications, insertions, and translocations) are responsible for a small percentage of individuals with intellectual disability.  Many individuals  with chromosome aberrations have additional differences,  including congenital anomalies or minor dysmorphisms.  Likewise, single gene conditions are the underlying cause of intellectual delay in some families.  Many gene conditions have intellectual disability as a feature, but also often include other physical or medical differences. We discussed that without more specific information, it is difficult to provide an accurate risk assessment.  Further genetic counseling is warranted if more information is obtained.  Additionally, the Jill [name] reported that he is 43 years old. This couple was counseled that advanced paternal age (APA) is defined as paternal age greater than or equal to age 17.  Recent large-scale sequencing studies have shown that approximately 80% of de novo point mutations are of paternal origin.  Many studies have demonstrated a strong correlation between increased paternal age and de novo point mutations.  Although no specific data is available regarding fetal risks for fathers 65+ years old at conception, it is apparent that the overall risk for single gene conditions is increased.  To estimate the relative increase in risk of a genetic disorder with APA, the heritability of the disease must be considered.  Assuming an approximate 2x increase in risk for conditions that are exclusively paternal in origin, the risk for each individual condition is still relatively low.  It is estimated that the overall chance for a de novo mutation is ~0.5%.  We also discussed the wide range of conditions which can be caused by new dominant gene mutations (achondroplasia, neurofibromatosis, Marfan syndrome etc.).  They were counseled that genetic testing for each individual single gene condition is not warranted or available unless ultrasound or family history concerns lend suspicion to a specific condition.   Jill [name] denied exposure to environmental toxins or chemical agents. She  denied the use of alcohol, tobacco or street drugs. She denied significant viral illnesses during the course of her [name]. Her medical and surgical histories were noncontributory.   I counseled this couple regarding the above risks and available options.  The approximate face-to-face time with the genetic counselor was 45 minutes.  Jill App Jeni Salles, MS,  Certified Genetic Counselor 08/16/2013

## 2013-08-16 NOTE — Progress Notes (Signed)
[facility] ultrasound  Indication: 43 yr old G1P0 at 41w5dwith twin pregnancy for fetal ultrasound.  Findings: 1. Monochorionic/diamniotic twin gestation; the dividing membrane is seen. 2. Fetal crown rump lengths are consistent with dating; although there is a 5 day discrepancy between the fetuses. 3. Normal uterus; no adnexal masses seen. 4. Evaluation of fetal anatomy is limited by early gestational age. 5. Normal nuchal translucencies measuring 1.150mfor both fetuses. 6. The nasal bone is seen in each fetus.  Recommendations: 1. Appropriate dating although discussed discrepant crown rump lengths and that this may be an early sign of twin twin transfusion syndrome. 2. Monochorionic/diamniotic twin gestation:  I discussed the following increased risks with monochorionic/diamniotic twin gestation:  1.  risk of preterm delivery: discussed risk of delivering prior to 37 weeks is approximately 60%; risk of delivering prior to 32 weeks is approximately 11%-  Preterm delivery may be spontaneous or iatrogenic from complications of pregnancy - I recommend close monitoring for the development of signs/symptoms of preterm labor/PPROM  2. Increased risk of gestational diabetes: recommend screening at 24-26 weeks 3. Discussed increased risk of preeclampsia: recommend close surveillance for the development of signs/symptoms of preeclampsia in the 2nd and 3rd trimester 4. Discussed the increased risk of requiring a Cesarean delivery 5. Discussed risk of fetal growth restriction: recommend serial fetal growth ultrasounds every 2-4 weeks 6. Discussed increased risk of congenital anomalies of approximately 3-6%: recommend fetal anatomic survey at 18-[redacted] weeks gestation and fetal echocardiogram 7. Discussed increased risk of fetal death: recommend antenatal testing starting at [redacted] weeks gestation; recommend delivery at 36-[redacted] weeks gestation if pregnancy remains uncomplicated 8. Discussed  risk of twin twin transfusion syndrome is approximately 10-15%. Recommend ultrasounds every 2 weeks starting at [redacted] weeks gestation to screen for twin twin transfusion syndrome. Briefly discussed if this were to develop there are potential interventions such as amnioreduction and laser therapy.  3. Advanced maternal age: - patient met with the genetic counselor; see separate report - after counseling patient declined further testing today  With maternal age over 4059here is increased risk of gestational diabetes, fetal growth restriction, need for Cesarean delivery, and stillbirth. Recommend fetal surveillance as above  [place]lam CityMD

## 2013-08-23 ENCOUNTER — Ambulatory Visit: Payer: Medicaid Other | Attending: Internal Medicine | Admitting: Internal Medicine

## 2013-08-23 ENCOUNTER — Encounter: Payer: Self-pay | Admitting: Internal Medicine

## 2013-08-23 VITALS — BP 100/60 | HR 107 | Temp 99.0°F | Resp 14 | Ht 63.0 in | Wt 109.0 lb

## 2013-08-23 DIAGNOSIS — R531 Weakness: Secondary | ICD-10-CM

## 2013-08-23 DIAGNOSIS — Z331 Pregnant state, incidental: Secondary | ICD-10-CM

## 2013-08-23 DIAGNOSIS — R5383 Other fatigue: Secondary | ICD-10-CM

## 2013-08-23 DIAGNOSIS — R5381 Other malaise: Secondary | ICD-10-CM | POA: Diagnosis not present

## 2013-08-23 DIAGNOSIS — O9989 Other specified diseases and conditions complicating pregnancy, childbirth and the puerperium: Principal | ICD-10-CM

## 2013-08-23 DIAGNOSIS — O99891 Other specified diseases and conditions complicating pregnancy: Secondary | ICD-10-CM | POA: Insufficient documentation

## 2013-08-23 DIAGNOSIS — Z139 Encounter for screening, unspecified: Secondary | ICD-10-CM

## 2013-08-23 DIAGNOSIS — Z3490 Encounter for supervision of normal pregnancy, unspecified, unspecified trimester: Secondary | ICD-10-CM

## 2013-08-23 LAB — CBC WITH DIFFERENTIAL/PLATELET
BASOS PCT: 0 % (ref 0–1)
Basophils Absolute: 0 10*3/uL (ref 0.0–0.1)
EOS ABS: 0 10*3/uL (ref 0.0–0.7)
Eosinophils Relative: 0 % (ref 0–5)
HCT: 30.2 % — ABNORMAL LOW (ref 36.0–46.0)
Hemoglobin: 10.7 g/dL — ABNORMAL LOW (ref 12.0–15.0)
Lymphocytes Relative: 18 % (ref 12–46)
Lymphs Abs: 1.7 10*3/uL (ref 0.7–4.0)
MCH: 31.7 pg (ref 26.0–34.0)
MCHC: 35.4 g/dL (ref 30.0–36.0)
MCV: 89.3 fL (ref 78.0–100.0)
MONOS PCT: 6 % (ref 3–12)
Monocytes Absolute: 0.6 10*3/uL (ref 0.1–1.0)
Neutro Abs: 7.1 10*3/uL (ref 1.7–7.7)
Neutrophils Relative %: 76 % (ref 43–77)
Platelets: 209 10*3/uL (ref 150–400)
RBC: 3.38 MIL/uL — ABNORMAL LOW (ref 3.87–5.11)
RDW: 13.6 % (ref 11.5–15.5)
WBC: 9.3 10*3/uL (ref 4.0–10.5)

## 2013-08-23 MED ORDER — DAILY VALUE MULTIVITAMIN PO TABS: 1.0000 | ORAL_TABLET | Freq: Every day | ORAL | Status: DC

## 2013-08-23 MED ORDER — FOLIC ACID 1 MG PO TABS: 1.0000 mg | ORAL_TABLET | Freq: Every day | ORAL | Status: DC

## 2013-08-23 NOTE — Progress Notes (Signed)
Pt here to establish care for PCP Pt is currently pregnant G1P0 13 weeks with twins Pt is c/o intermit abdominal cramping without bleeding Appears weak and pale. BP 94/64 107 ortho stats negative Swelling noted in both ankles Pt has scheduled appt with [facility] tomorrow

## 2013-08-23 NOTE — Progress Notes (Signed)
Patient Demographics  Jill [name], is a 43 y.o. female  MMC:[id number]  PCH:[id number]  DOB - 25-Oct-1970  CC:  Chief Complaint  Patient presents with  . Hospitalization Follow-up  . Routine Prenatal Visit       HPI: Jill [name] is a 43 y.o. female here today to establish medical care. Patient is currently pregnant her with twins, following up with OB/GYN as per patient she only takes vitamin B6, she reported to have some weakness recently denies any chest and shortness of breath denies any headache dizziness nausea vomiting her blood pressure is always on the low side today her manual blood pressure is 100/60, she denies any previous medical problems. Patient reported to have some swelling in the legs. Patient has No headache, No chest pain, No abdominal pain - No Nausea, No new weakness tingling or numbness, No Cough - SOB.  No Known Allergies Past Medical History  Diagnosis Date  . Medical history non-contributory   . Polyp of cervix    Current Outpatient Prescriptions on File Prior to Visit  Medication Sig Dispense Refill  . doxylamine, Sleep, (UNISOM) 25 MG tablet Take 25 mg by mouth at bedtime.      . promethazine (PHENERGAN) 25 MG tablet Take 0.5 tablets (12.5 mg total) by mouth every 6 (six) hours as needed for nausea or vomiting.  30 tablet  0  . pyridOXINE (VITAMIN B-6) 100 MG tablet Take 100 mg by mouth daily. States she takes half a pill in morning and other half at night.      Marland Kitchen acetaminophen (TYLENOL) 325 MG tablet Take by mouth every 6 (six) hours as needed for headache.       No current facility-administered medications on file prior to visit.   Family History  Problem Relation Age of Onset  . Diabetes Mother   . Heart disease Mother    History   Social History  . Marital Status: Married    Spouse Name: N/A    Number of Children: N/A  . Years of Education: N/A   Occupational History  . Not on file.   Social History Main Topics  .  Smoking status: Never Smoker   . Smokeless tobacco: Never Used  . Alcohol Use: No  . Drug Use: No  . Sexual Activity: Yes    Birth Control/ Protection: None   Other Topics Concern  . Not on file   Social History Narrative  . No narrative on file    Review of Systems: Constitutional: Negative for fever, chills, diaphoresis, activity change, appetite change and fatigue. HENT: Negative for ear pain, nosebleeds, congestion, facial swelling, rhinorrhea, neck pain, neck stiffness and ear discharge.  Eyes: Negative for pain, discharge, redness, itching and visual disturbance. Respiratory: Negative for cough, choking, chest tightness, shortness of breath, wheezing and stridor.  Cardiovascular: Negative for chest pain, palpitations and leg swelling. Gastrointestinal: Negative for abdominal distention. Genitourinary: Negative for dysuria, urgency, frequency, hematuria, flank pain, decreased urine volume, difficulty urinating and dyspareunia.  Musculoskeletal: Negative for back pain, joint swelling, arthralgia and gait problem. Neurological: Negative for dizziness, tremors, seizures, syncope, facial asymmetry, speech difficulty, weakness, light-headedness, numbness and headaches.  Hematological: Negative for adenopathy. Does not bruise/bleed easily. Psychiatric/Behavioral: Negative for hallucinations, behavioral problems, confusion, dysphoric mood, decreased concentration and agitation.    Objective:   Filed Vitals:   08/23/13 1631  BP: 100/60  Pulse:   Temp:   Resp:     Physical Exam: Constitutional: Patient appears well-developed and  well-nourished. No distress. HENT: Normocephalic, atraumatic, External right and left ear normal. Oropharynx is clear and moist.  Eyes: Conjunctivae pale  EOM are normal. PERRLA, no scleral icterus. Neck: Normal ROM. Neck supple. No JVD. No tracheal deviation. No thyromegaly. CVS: RRR, S1/S2 +, no murmurs, no gallops, no carotid bruit.  Pulmonary:  Effort and breath sounds normal, no stridor, rhonchi, wheezes, rales.  Abdominal: Soft. BS +, no distension, tenderness, rebound or guarding.  Musculoskeletal: Normal range of motion. No edema and no tenderness.  Neuro: Alert. Normal reflexes, muscle tone coordination. No cranial nerve deficit. Skin: Skin is warm and dry. No rash noted. Not diaphoretic. No erythema. No pallor. Psychiatric: Normal mood and affect. Behavior, judgment, thought content normal.  Lab Results  Component Value Date   WBC 7.6 06/24/2013   HGB 12.0 06/24/2013   HCT 35.4* 06/24/2013   MCV 88.9 06/24/2013   PLT 214 06/24/2013   Lab Results  Component Value Date   CREATININE 0.64 06/29/2013   BUN 6 06/29/2013   NA 137 06/29/2013   K 4.0 06/29/2013   CL 102 06/29/2013   CO2 23 06/29/2013    No results found for this basename: HGBA1C   Lipid Panel  No results found for this basename: chol, trig, hdl, cholhdl, vldl, ldlcalc       Assessment and plan:   1. Weakness Have ordered blood work. - CBC with Differential - COMPLETE METABOLIC PANEL WITH GFR  2. Screening  - Vit D  25 hydroxy (rtn osteoporosis monitoring)  3. Pregnant Started patient on multivitamin and folic acid. Patient will follow up with her OB/GYN - Multiple Vitamin (DAILY VALUE MULTIVITAMIN) TABS; Take 1 tablet by mouth daily.  Dispense: 30 tablet; Refill: 6 - folic acid (FOLVITE) 1 MG tablet; Take 1 tablet (1 mg total) by mouth daily.  Dispense: 30 tablet; Refill: 6   Return in about 3 months (around 11/23/2013).   Doris Cheadleeepak Eivan Gallina, MD

## 2013-08-24 ENCOUNTER — Ambulatory Visit (INDEPENDENT_AMBULATORY_CARE_PROVIDER_SITE_OTHER): Payer: Medicaid Other | Admitting: Advanced Practice Midwife

## 2013-08-24 ENCOUNTER — Other Ambulatory Visit: Payer: Self-pay

## 2013-08-24 VITALS — BP 111/67 | HR 103 | Wt 107.4 lb

## 2013-08-24 DIAGNOSIS — O09529 Supervision of elderly multigravida, unspecified trimester: Secondary | ICD-10-CM

## 2013-08-24 DIAGNOSIS — Z331 Pregnant state, incidental: Secondary | ICD-10-CM

## 2013-08-24 DIAGNOSIS — Z3490 Encounter for supervision of normal pregnancy, unspecified, unspecified trimester: Secondary | ICD-10-CM

## 2013-08-24 DIAGNOSIS — IMO0002 Reserved for concepts with insufficient information to code with codable children: Secondary | ICD-10-CM

## 2013-08-24 DIAGNOSIS — O30039 Twin pregnancy, monochorionic/diamniotic, unspecified trimester: Secondary | ICD-10-CM

## 2013-08-24 DIAGNOSIS — O30009 Twin pregnancy, unspecified number of placenta and unspecified number of amniotic sacs, unspecified trimester: Secondary | ICD-10-CM

## 2013-08-24 LAB — COMPLETE METABOLIC PANEL WITH GFR
ALK PHOS: 37 U/L — AB (ref 39–117)
ALT: 16 U/L (ref 0–35)
AST: 20 U/L (ref 0–37)
Albumin: 3.1 g/dL — ABNORMAL LOW (ref 3.5–5.2)
BILIRUBIN TOTAL: 0.2 mg/dL (ref 0.2–1.2)
BUN: 5 mg/dL — ABNORMAL LOW (ref 6–23)
CO2: 22 mEq/L (ref 19–32)
Calcium: 8.3 mg/dL — ABNORMAL LOW (ref 8.4–10.5)
Chloride: 103 mEq/L (ref 96–112)
Creat: 0.44 mg/dL — ABNORMAL LOW (ref 0.50–1.10)
GFR, Est African American: >89 mL/min
GLUCOSE: 96 mg/dL (ref 70–99)
Potassium: 4 mEq/L (ref 3.5–5.3)
Sodium: 136 mEq/L (ref 135–145)
TOTAL PROTEIN: 5.3 g/dL — AB (ref 6.0–8.3)

## 2013-08-24 LAB — POCT URINALYSIS DIP (DEVICE)
Bilirubin Urine: NEGATIVE
Glucose, UA: 250 mg/dL — AB
KETONES UR: NEGATIVE mg/dL
Nitrite: NEGATIVE
PH: 6 (ref 5.0–8.0)
Protein, ur: NEGATIVE mg/dL
Specific Gravity, Urine: <=1.005 (ref 1.005–1.030)
Urobilinogen, UA: 0.2 mg/dL (ref 0.0–1.0)

## 2013-08-24 LAB — VITAMIN D 25 HYDROXY (VIT D DEFICIENCY, FRACTURES): Vit D, 25-Hydroxy: <10 ng/mL — ABNORMAL LOW (ref 30–89)

## 2013-08-24 MED ORDER — FOLIC ACID 1 MG PO TABS: 1.0000 mg | ORAL_TABLET | Freq: Every day | ORAL | Status: DC

## 2013-08-24 MED ORDER — PRENATAL VITAMINS 28-0.8 MG PO TABS: 1.0000 | ORAL_TABLET | Freq: Every day | ORAL | Status: DC

## 2013-08-24 NOTE — Progress Notes (Signed)
Folic acid re-prescribed to e-prescribe--- printed prior to.

## 2013-08-24 NOTE — Progress Notes (Signed)
Has a small amount of bleeding from polyp.

## 2013-08-24 NOTE — Progress Notes (Signed)
Doing well.  Denies vaginal bleeding, LOF.  Today, she denies abdominal pain but does have mild back pain. Yesterday, she had both abdominal and back pain. Denies dysuria.  Recommend increase PO fluid, will send urine for culture.  Reports leg cramping, recommend prenatal vitamin, stretch/warm bath before bed.  FHR 147 Baby A and 153 Baby B.  Some light spotting occasionally, no change to bleeding since dx of polyp.

## 2013-08-26 LAB — CULTURE, OB URINE

## 2013-09-01 ENCOUNTER — Telehealth: Payer: Self-pay

## 2013-09-01 MED ORDER — VITAMIN D (ERGOCALCIFEROL) 1.25 MG (50000 UNIT) PO CAPS: 50000.0000 [IU] | ORAL_CAPSULE | ORAL | Status: DC

## 2013-09-01 NOTE — Telephone Encounter (Signed)
Spoke with patient's husband He will let his wife know her lab results Prescription sent to [facility] on file

## 2013-09-01 NOTE — Telephone Encounter (Signed)
Message copied by Lestine Mount on Thu Sep 01, 2013  9:13 AM ------      Message from: Doris Cheadle      Created: Wed Aug 24, 2013 10:33 AM       Blood work reviewed, noticed low vitamin D, call patient advise to start ergocalciferol 50,000 units once a week for the duration of  12 weeks.      The patient also has anemia, advise patient to take over-the-counter iron supplement 3 times a day. ------

## 2013-09-06 ENCOUNTER — Ambulatory Visit (HOSPITAL_COMMUNITY): Payer: Medicaid Other

## 2013-09-07 ENCOUNTER — Telehealth: Payer: Self-pay | Admitting: *Deleted

## 2013-09-07 ENCOUNTER — Encounter (HOSPITAL_COMMUNITY): Payer: Self-pay | Admitting: *Deleted

## 2013-09-07 ENCOUNTER — Inpatient Hospital Stay (HOSPITAL_COMMUNITY)
Admission: AD | Admit: 2013-09-07 | Discharge: 2013-09-07 | Disposition: A | Discharge status: Home / Self Care | Payer: Medicaid Other | Source: Ambulatory Visit | Attending: Obstetrics & Gynecology | Admitting: Obstetrics & Gynecology

## 2013-09-07 DIAGNOSIS — O30031 Twin pregnancy, monochorionic/diamniotic, first trimester: Secondary | ICD-10-CM

## 2013-09-07 DIAGNOSIS — M79609 Pain in unspecified limb: Secondary | ICD-10-CM | POA: Insufficient documentation

## 2013-09-07 DIAGNOSIS — M545 Low back pain, unspecified: Secondary | ICD-10-CM | POA: Insufficient documentation

## 2013-09-07 DIAGNOSIS — O99891 Other specified diseases and conditions complicating pregnancy: Secondary | ICD-10-CM | POA: Insufficient documentation

## 2013-09-07 DIAGNOSIS — O09529 Supervision of elderly multigravida, unspecified trimester: Secondary | ICD-10-CM | POA: Insufficient documentation

## 2013-09-07 DIAGNOSIS — M79605 Pain in left leg: Secondary | ICD-10-CM

## 2013-09-07 DIAGNOSIS — O9989 Other specified diseases and conditions complicating pregnancy, childbirth and the puerperium: Principal | ICD-10-CM

## 2013-09-07 DIAGNOSIS — IMO0002 Reserved for concepts with insufficient information to code with codable children: Secondary | ICD-10-CM

## 2013-09-07 LAB — URINE MICROSCOPIC-ADD ON

## 2013-09-07 LAB — URINALYSIS, ROUTINE W REFLEX MICROSCOPIC
BILIRUBIN URINE: NEGATIVE
Glucose, UA: NEGATIVE mg/dL
Hgb urine dipstick: NEGATIVE
KETONES UR: NEGATIVE mg/dL
NITRITE: NEGATIVE
PH: 7 (ref 5.0–8.0)
Protein, ur: NEGATIVE mg/dL
Specific Gravity, Urine: 1.01 (ref 1.005–1.030)
UROBILINOGEN UA: 0.2 mg/dL (ref 0.0–1.0)

## 2013-09-07 MED ORDER — ACETAMINOPHEN 325 MG PO TABS
650.0000 mg | ORAL_TABLET | Freq: Once | ORAL | Status: AC
  Administered 2013-09-07: 650 mg via ORAL
  Filled 2013-09-07: qty 2

## 2013-09-07 MED ORDER — ACETAMINOPHEN 325 MG PO TABS: 650.0000 mg | ORAL_TABLET | Freq: Once | ORAL | Status: DC

## 2013-09-07 NOTE — MAU Note (Signed)
Has US next wk, needs prenatal vits

## 2013-09-07 NOTE — MAU Note (Signed)
Pain started yesterday low back  And behind left knee. Pain is constant

## 2013-09-07 NOTE — Discharge Instructions (Signed)
Deep Vein Thrombosis (We do not know for sure that you have this)  A deep vein thrombosis (DVT) is a blood clot that develops in the deep, larger veins of the leg, arm, or pelvis. These are more dangerous than clots that might form in veins near the surface of the body. A DVT can lead to complications if the clot breaks off and travels in the bloodstream to the lungs.  A DVT can damage the valves in your leg veins, so that instead of flowing upward, the blood pools in the lower leg. This is called post-thrombotic syndrome, and it can result in pain, swelling, discoloration, and sores on the leg. CAUSES Usually, several things contribute to blood clots forming. Contributing factors include:  The flow of blood slows down.  The inside of the vein is damaged in some way.  You have a condition that makes blood clot more easily. RISK FACTORS Some people are more likely than others to develop blood clots. Risk factors include:   Older age, especially over 101 years of age.  Having a family history of blood clots or if you have already had a blot clot.  Having major or lengthy surgery. This is especially true for surgery on the hip, knee, or belly (abdomen). Hip surgery is particularly high risk.  Breaking a hip or leg.  Sitting or lying still for a long time. This includes long-distance travel, paralysis, or recovery from an illness or surgery.  Having cancer or cancer treatment.  Having a long, thin tube (catheter) placed inside a vein during a medical procedure.  Being overweight (obese).  Pregnancy and childbirth.  Hormone changes make the blood clot more easily during pregnancy.  The fetus puts pressure on the veins of the pelvis.  There is a risk of injury to veins during delivery or a caesarean. The risk is highest just after childbirth.  Medicines with the female hormone estrogen. This includes birth control pills and hormone replacement therapy.  Smoking.  Other circulation  or heart problems.  SIGNS AND SYMPTOMS When a clot forms, it can either partially or totally block the blood flow in that vein. Symptoms of a DVT can include:  Swelling of the leg or arm, especially if one side is much worse.  Warmth and redness of the leg or arm, especially if one side is much worse.  Pain in an arm or leg. If the clot is in the leg, symptoms may be more noticeable or worse when standing or walking. The symptoms of a DVT that has traveled to the lungs (pulmonary embolism, PE) usually start suddenly and include:  Shortness of breath.  Coughing.  Coughing up blood or blood-tinged phlegm.  Chest pain. The chest pain is often worse with deep breaths.  Rapid heartbeat. Anyone with these symptoms should get emergency medical treatment right away. Call your local emergency services (911 in the [place] if you have these symptoms. DIAGNOSIS If a DVT is suspected, your health care provider will take a full medical history and perform a physical exam. Tests that also may be required include:  Blood tests, including studies of the clotting properties of the blood.  Ultrasonography to see if you have clots in your legs or lungs.  X-rays to show the flow of blood when dye is injected into the veins (venography).  Studies of your lungs if you have any chest symptoms. PREVENTION  Exercise the legs regularly. Take a brisk 30-minute walk every day.  Maintain a weight that is  appropriate for your height.  Avoid sitting or lying in bed for long periods of time without moving your legs.  Women, particularly those over the age of 35 years, should consider the risks and benefits of taking estrogen medicines, including birth control pills.  Do not smoke, especially if you take estrogen medicines.  Long-distance travel can increase your risk of DVT. You should exercise your legs by walking or pumping the muscles every hour.  In-hospital prevention:  Many of the risk factors  above relate to situations that exist with hospitalization, either for illness, injury, or elective surgery.  Your health care provider will assess you for the need for venous thromboembolism prophylaxis when you are admitted to the hospital. If you are having surgery, your surgeon will assess you the day of or day after surgery.  Prevention may include medical and nonmedical measures. TREATMENT Once identified, a DVT can be treated. It can also be prevented in some circumstances. Once you have had a DVT, you may be at increased risk for a DVT in the future. The most common treatment for DVT is blood thinning (anticoagulant) medicine, which reduces the blood's tendency to clot. Anticoagulants can stop new blood clots from forming and stop old ones from growing. They cannot dissolve existing clots. Your body does this by itself over time. Anticoagulants can be given by mouth, by IV access, or by injection. Your health care provider will determine the best program for you. Other medicines or treatments that may be used are:  Heparin or related medicines (low molecular weight heparin) are usually the first treatment for a blood clot. They act quickly. However, they cannot be taken orally.  Heparin can cause a fall in a component of blood that stops bleeding and forms blood clots (platelets). You will be monitored with blood tests to be sure this does not occur.  Warfarin is an anticoagulant that can be swallowed. It takes a few days to start working, so usually heparin or related medicines are used in combination. Once warfarin is working, heparin is usually stopped.  Less commonly, clot dissolving drugs (thrombolytics) are used to dissolve a DVT. They carry a high risk of bleeding, so they are used mainly in severe cases, where your life or a limb is threatened.  Very rarely, a blood clot in the leg needs to be removed surgically.  If you are unable to take anticoagulants, your health care provider  may arrange for you to have a filter placed in a main vein in your abdomen. This filter prevents clots from traveling to your lungs. HOME CARE INSTRUCTIONS  Take all medicines prescribed by your health care provider. Only take over-the-counter or prescription medicines for pain, fever, or discomfort as directed by your health care provider.  Warfarin. Most people will continue taking warfarin after hospital discharge. Your health care provider will advise you on the length of treatment (usually 3-6 months, sometimes lifelong).  Too much and too little warfarin are both dangerous. Too much warfarin increases the risk of bleeding. Too little warfarin continues to allow the risk for blood clots. While taking warfarin, you will need to have regular blood tests to measure your blood clotting time. These blood tests usually include both the prothrombin time (PT) and international normalized ratio (INR) tests. The PT and INR results allow your health care provider to adjust your dose of warfarin. The dose can change for many reasons. It is critically important that you take warfarin exactly as prescribed, and that you  have your PT and INR levels drawn exactly as directed.  Many foods, especially foods high in vitamin K, can interfere with warfarin and affect the PT and INR results. Foods high in vitamin K include spinach, kale, broccoli, cabbage, collard and turnip greens, brussel sprouts, peas, cauliflower, seaweed, and parsley as well as beef and pork liver, green tea, and soybean oil. You should eat a consistent amount of foods high in vitamin K. Avoid major changes in your diet, or notify your health care provider before changing your diet. Arrange a visit with a dietitian to answer your questions.  Many medicines can interfere with warfarin and affect the PT and INR results. You must tell your health care provider about any and all medicines you take. This includes all vitamins and supplements. Be  especially cautious with aspirin and anti-inflammatory medicines. Ask your health care provider before taking these. Do not take or discontinue any prescribed or over-the-counter medicine except on the advice of your health care provider or pharmacist.  Warfarin can have side effects, primarily excessive bruising or bleeding. You will need to hold pressure over cuts for longer than usual. Your health care provider or pharmacist will discuss other potential side effects.  Alcohol can change the body's ability to handle warfarin. It is best to avoid alcoholic drinks or consume only very small amounts while taking warfarin. Notify your health care provider if you change your alcohol intake.  Notify your dentist or other health care providers before procedures.  Activity. Ask your health care provider how soon you can go back to normal activities. It is important to stay active to prevent blood clots. If you are on anticoagulant medicine, avoid contact sports.  Exercise. It is very important to exercise. This is especially important while traveling, sitting, or standing for long periods of time. Exercise your legs by walking or by pumping the muscles frequently. Take frequent walks.  Compression stockings. These are tight elastic stockings that apply pressure to the lower legs. This pressure can help keep the blood in the legs from clotting. You may need to wear compression stockings at home to help prevent a DVT.  Do not smoke. If you smoke, quit. Ask your health care provider for help with quitting smoking.  Learn as much as you can about DVT. Knowing more about the condition should help you keep it from coming back.  Wear a medical alert bracelet or carry a medical alert card. SEEK MEDICAL CARE IF:  You notice a rapid heartbeat.  You feel weaker or more tired than usual.  You feel faint.  You notice increased bruising.  You feel your symptoms are not getting better in the time  expected.  You believe you are having side effects of medicine. SEEK IMMEDIATE MEDICAL CARE IF:  You have chest pain.  You have trouble breathing.  You have new or increased swelling or pain in one leg.  You cough up blood.  You notice blood in vomit, in a bowel movement, or in urine. MAKE SURE YOU:  Understand these instructions.  Will watch your condition.  Will get help right away if you are not doing well or get worse. Document Released: 03/10/2005 Document Revised: 12/29/2012 Document Reviewed: 11/15/2012 [facility] Patient Information 2015 [place]. This information is not intended to replace advice given to you by your health care provider. Make sure you discuss any questions you have with your health care provider.   Back Pain in Pregnancy Back pain during pregnancy is common.  It happens in about half of all pregnancies. It is important for you and your baby that you remain active during your pregnancy.If you feel that back pain is not allowing you to remain active or sleep well, it is time to see your caregiver. Back pain may be caused by several factors related to changes during your pregnancy.Fortunately, unless you had trouble with your back before your pregnancy, the pain is likely to get better after you deliver. Low back pain usually occurs between the fifth and seventh months of pregnancy. It can, however, happen in the first couple months. Factors that increase the risk of back problems include:   Previous back problems.  Injury to your back.  Having twins or multiple births.  A chronic cough.  Stress.  Job-related repetitive motions.  Muscle or spinal disease in the back.  Family history of back problems, ruptured (herniated) discs, or osteoporosis.  Depression, anxiety, and panic attacks. CAUSES   When you are pregnant, your body produces a hormone called relaxin. This hormonemakes the ligaments connecting the low back and pubic bones more  flexible. This flexibility allows the baby to be delivered more easily. When your ligaments are loose, your muscles need to work harder to support your back. Soreness in your back can come from tired muscles. Soreness can also come from back tissues that are irritated since they are receiving less support.  As the baby grows, it puts pressure on the nerves and blood vessels in your pelvis. This can cause back pain.  As the baby grows and gets heavier during pregnancy, the uterus pushes the stomach muscles forward and changes your center of gravity. This makes your back muscles work harder to maintain good posture. SYMPTOMS  Lumbar pain during pregnancy Lumbar pain during pregnancy usually occurs at or above the waist in the center of the back. There may be pain and numbness that radiates into your leg or foot. This is similar to low back pain experienced by non-pregnant women. It usually increases with sitting for long periods of time, standing, or repetitive lifting. Tenderness may also be present in the muscles along your upper back. Posterior pelvic pain during pregnancy Pain in the back of the pelvis is more common than lumbar pain in pregnancy. It is a deep pain felt in your side at the waistline, or across the tailbone (sacrum), or in both places. You may have pain on one or both sides. This pain can also go into the buttocks and backs of the upper thighs. Pubic and groin pain may also be present. The pain does not quickly resolve with rest, and morning stiffness may also be present. Pelvic pain during pregnancy can be brought on by most activities. A high level of fitness before and during pregnancy may or may not prevent this problem. Labor pain is usually 1 to 2 minutes apart, lasts for about 1 minute, and involves a bearing down feeling or pressure in your pelvis. However, if you are at term with the pregnancy, constant low back pain can be the beginning of early labor, and you should be aware of  this. DIAGNOSIS  X-rays of the back should not be done during the first 12 to 14 weeks of the pregnancy and only when absolutely necessary during the rest of the pregnancy. MRIs do not give off radiation and are safe during pregnancy. MRIs also should only be done when absolutely necessary. HOME CARE INSTRUCTIONS  Exercise as directed by your caregiver. Exercise is the most  effective way to prevent or manage back pain. If you have a back problem, it is especially important to avoid sports that require sudden body movements. Swimming and walking are great activities.  Do not stand in one place for long periods of time.  Do not wear high heels.  Sit in chairs with good posture. Use a pillow on your lower back if necessary. Make sure your head rests over your shoulders and is not hanging forward.  Try sleeping on your side, preferably the left side, with a pillow or two between your legs. If you are sore after a night's rest, your bedmay betoo soft.Try placing a board between your mattress and box spring.  Listen to your body when lifting.If you are experiencing pain, ask for help or try bending yourknees more so you can use your leg muscles rather than your back muscles. Squat down when picking up something from the floor. Do not bend over.  Eat a healthy diet. Try to gain weight within your caregiver's recommendations.  Use heat or cold packs 3 to 4 times a day for 15 minutes to help with the pain.  Only take over-the-counter or prescription medicines for pain, discomfort, or fever as directed by your caregiver. Sudden (acute) back pain  Use bed rest for only the most extreme, acute episodes of back pain. Prolonged bed rest over 48 hours will aggravate your condition.  Ice is very effective for acute conditions.  Put ice in a plastic bag.  Place a towel between your skin and the bag.  Leave the ice on for 10 to 20 minutes every 2 hours, or as needed.  Using heat packs for 30  minutes prior to activities is also helpful. Continued back pain See your caregiver if you have continued problems. Your caregiver can help or refer you for appropriate physical therapy. With conditioning, most back problems can be avoided. Sometimes, a more serious issue may be the cause of back pain. You should be seen right away if new problems seem to be developing. Your caregiver may recommend:  A maternity girdle.  An elastic sling.  A back brace.  A massage therapist or acupuncture. SEEK MEDICAL CARE IF:   You are not able to do most of your daily activities, even when taking the pain medicine you were given.  You need a referral to a physical therapist or chiropractor.  You want to try acupuncture. SEEK IMMEDIATE MEDICAL CARE IF:  You develop numbness, tingling, weakness, or problems with the use of your arms or legs.  You develop severe back pain that is no longer relieved with medicines.  You have a sudden change in bowel or bladder control.  You have increasing pain in other areas of the body.  You develop shortness of breath, dizziness, or fainting.  You develop nausea, vomiting, or sweating.  You have back pain which is similar to labor pains.  You have back pain along with your water breaking or vaginal bleeding.  You have back pain or numbness that travels down your leg.  Your back pain developed after you fell.  You develop pain on one side of your back. You may have a kidney stone.  You see blood in your urine. You may have a bladder infection or kidney stone.  You have back pain with blisters. You may have shingles. Back pain is fairly common during pregnancy but should not be accepted as just part of the process. Back pain should always be treated as soon  as possible. This will make your pregnancy as pleasant as possible.  Document Released: 06/18/2005 Document Revised: 06/02/2011 Document Reviewed: 07/30/2010 [facility] Patient Information 2015  [place], [place]. This information is not intended to replace advice given to you by your health care provider. Make sure you discuss any questions you have with your health care provider.

## 2013-09-07 NOTE — Telephone Encounter (Signed)
Received a call from patient's husband stating his wife is having back and leg pain since yesterday. Patient's husband states she has not had any type of injury. Informed patient's husband since the wife is pregnant [facility]. Patient husband's verbalized understanding. Annamaria Hellingose,Jamie Renee, RN

## 2013-09-07 NOTE — MAU Note (Signed)
Patient discharge instructions reviewed; [facility]. Tylenol given for pain; patient refused to stay for pain re-evaluation due to another appointment and had to leave.

## 2013-09-07 NOTE — MAU Provider Note (Signed)
History     CSN: [id number]  Arrival date and time: 09/07/13 1529   First Provider Initiated Contact with Patient 09/07/13 1614      Chief Complaint  Patient presents with  . Back Pain  . Leg Pain   Back Pain Associated symptoms include leg pain. Pertinent negatives include no abdominal pain or fever.  Leg Pain   This is a 43 y.o. female at 2041w1d who presents with c/o low back pain and pain in the back of her left knee. States pain is constant and started yesterday. Has had this back pain before.  No swelling associated with it. HAs twin gestation. Gets care in clinic  RN Note:  Pain started yesterday low back And behind left knee. Pain is constant        OB History   Grav Para Term Preterm Abortions TAB SAB Ect Mult Living   1               Past Medical History  Diagnosis Date  . Medical history non-contributory   . Polyp of cervix     Past Surgical History  Procedure Laterality Date  . No past surgeries      Family History  Problem Relation Age of Onset  . Diabetes Mother   . Heart disease Mother     History  Substance Use Topics  . Smoking status: Never Smoker   . Smokeless tobacco: Never Used  . Alcohol Use: No    Allergies: No Known Allergies  Prescriptions prior to admission  Medication Sig Dispense Refill  . acetaminophen (TYLENOL) 325 MG tablet Take by mouth every 6 (six) hours as needed for headache.      . doxylamine, Sleep, (UNISOM) 25 MG tablet Take 25 mg by mouth at bedtime.      . folic acid (FOLVITE) 1 MG tablet Take 1 tablet (1 mg total) by mouth daily.  30 tablet  6  . Multiple Vitamin (DAILY VALUE MULTIVITAMIN) TABS Take 1 tablet by mouth daily.  30 tablet  6  . Prenatal Vit-Fe Fumarate-FA (PRENATAL VITAMINS) 28-0.8 MG TABS Take 1 tablet by mouth daily.  30 tablet  10  . promethazine (PHENERGAN) 25 MG tablet Take 0.5 tablets (12.5 mg total) by mouth every 6 (six) hours as needed for nausea or vomiting.  30 tablet  0  . pyridOXINE  (VITAMIN B-6) 100 MG tablet Take 100 mg by mouth daily. States she takes half a pill in morning and other half at night.      . Vitamin D, Ergocalciferol, (DRISDOL) 50000 UNITS CAPS capsule Take 1 capsule (50,000 Units total) by mouth every 7 (seven) days.  12 capsule  0    Review of Systems  Constitutional: Negative for fever, chills and malaise/fatigue.  Gastrointestinal: Negative for nausea, vomiting and abdominal pain.  Musculoskeletal: Positive for back pain.       Pain in back of left knee   Physical Exam   Blood pressure 91/59, pulse 88, temperature 98.7 F (37.1 C), temperature source Oral, resp. rate 16, last menstrual period 05/19/2013, SpO2 99.00%.  Physical Exam  Constitutional: She is oriented to person, place, and time. She appears well-developed and well-nourished. No distress.  HENT:  Head: Normocephalic.  Cardiovascular: Normal rate.   Respiratory: Effort normal.  GI: Soft. She exhibits no distension. There is no tenderness. There is no rebound and no guarding.  Musculoskeletal: She exhibits tenderness (Left popliteal tenderness with ? cord palpable).  Tender over lower Sacral area  Neurological: She is alert and oriented to person, place, and time.  Skin: Skin is warm and dry.  Psychiatric: She has a normal mood and affect.    MAU Course  Procedures  MDM Results for orders placed during the hospital encounter of 09/07/13 (from the past 72 hour(s))  URINALYSIS, ROUTINE W REFLEX MICROSCOPIC     Status: Abnormal   Collection Time    09/07/13  3:43 PM      Result Value Ref Range   Color, Urine YELLOW  YELLOW   APPearance CLEAR  CLEAR   Specific Gravity, Urine 1.010  1.005 - 1.030   pH 7.0  5.0 - 8.0   Glucose, UA NEGATIVE  NEGATIVE mg/dL   Hgb urine dipstick NEGATIVE  NEGATIVE   Bilirubin Urine NEGATIVE  NEGATIVE   Ketones, ur NEGATIVE  NEGATIVE mg/dL   Protein, ur NEGATIVE  NEGATIVE mg/dL   Urobilinogen, UA 0.2  0.0 - 1.0 mg/dL   Nitrite NEGATIVE   NEGATIVE   Leukocytes, UA SMALL (*) NEGATIVE  URINE MICROSCOPIC-ADD ON     Status: Abnormal   Collection Time    09/07/13  3:43 PM      Result Value Ref Range   Squamous Epithelial / LPF FEW (*) RARE   WBC, UA 3-6  <3 WBC/hpf   RBC / HPF 3-6  <3 RBC/hpf   Bacteria, UA RARE  RARE     Assessment and Plan  A:  Twin IUP at 4747w1d        Low back pain       Popliteal pain/tenderness, r/o DVT  P   Offered Flexeril, wants to try only Tylenol       Unable to get Doppler study in time for pt to leave, will schedule for tomorrow morning at 9am        Offered dose of Lovenox prophylactically, patient declines       DVT precautions  [facility],Kailly Richoux 09/07/2013, 4:38 PM

## 2013-09-08 ENCOUNTER — Other Ambulatory Visit: Payer: Self-pay | Admitting: Obstetrics & Gynecology

## 2013-09-08 ENCOUNTER — Other Ambulatory Visit (HOSPITAL_COMMUNITY): Payer: Self-pay | Admitting: Advanced Practice Midwife

## 2013-09-08 ENCOUNTER — Ambulatory Visit (HOSPITAL_COMMUNITY)
Admission: RE | Admit: 2013-09-08 | Discharge: 2013-09-08 | Disposition: A | Discharge status: Home / Self Care | Payer: Medicaid Other | Source: Ambulatory Visit | Attending: Advanced Practice Midwife | Admitting: Advanced Practice Midwife

## 2013-09-08 DIAGNOSIS — M79609 Pain in unspecified limb: Secondary | ICD-10-CM

## 2013-09-08 DIAGNOSIS — M25569 Pain in unspecified knee: Secondary | ICD-10-CM

## 2013-09-08 MED ORDER — PRENATAL VITAMINS 28-0.8 MG PO TABS: 1.0000 | ORAL_TABLET | Freq: Every day | ORAL | Status: DC

## 2013-09-08 NOTE — Progress Notes (Signed)
Left lower extremity venous duplex completed.  Left:  No evidence of DVT, superficial thrombosis, or Baker's cyst.  Right:  Negative for DVT in the common femoral vein.  

## 2013-09-12 ENCOUNTER — Encounter (HOSPITAL_COMMUNITY): Payer: Self-pay

## 2013-09-12 ENCOUNTER — Other Ambulatory Visit: Payer: Self-pay | Admitting: Family Medicine

## 2013-09-12 ENCOUNTER — Other Ambulatory Visit (HOSPITAL_COMMUNITY): Payer: Self-pay | Admitting: Obstetrics and Gynecology

## 2013-09-12 ENCOUNTER — Ambulatory Visit (HOSPITAL_COMMUNITY)
Admission: RE | Admit: 2013-09-12 | Discharge: 2013-09-12 | Disposition: A | Discharge status: Home / Self Care | Payer: Medicaid Other | Source: Ambulatory Visit | Attending: Family Medicine | Admitting: Family Medicine

## 2013-09-12 VITALS — BP 94/64 | HR 97 | Wt 113.0 lb

## 2013-09-12 DIAGNOSIS — O09522 Supervision of elderly multigravida, second trimester: Secondary | ICD-10-CM

## 2013-09-12 DIAGNOSIS — IMO0001 Reserved for inherently not codable concepts without codable children: Secondary | ICD-10-CM

## 2013-09-12 DIAGNOSIS — O09529 Supervision of elderly multigravida, unspecified trimester: Secondary | ICD-10-CM

## 2013-09-12 DIAGNOSIS — Z3689 Encounter for other specified antenatal screening: Secondary | ICD-10-CM

## 2013-09-12 DIAGNOSIS — O30009 Twin pregnancy, unspecified number of placenta and unspecified number of amniotic sacs, unspecified trimester: Secondary | ICD-10-CM | POA: Insufficient documentation

## 2013-09-12 DIAGNOSIS — O30031 Twin pregnancy, monochorionic/diamniotic, first trimester: Secondary | ICD-10-CM

## 2013-09-12 DIAGNOSIS — O09519 Supervision of elderly primigravida, unspecified trimester: Secondary | ICD-10-CM | POA: Insufficient documentation

## 2013-09-12 DIAGNOSIS — IMO0002 Reserved for concepts with insufficient information to code with codable children: Secondary | ICD-10-CM

## 2013-09-12 DIAGNOSIS — O30039 Twin pregnancy, monochorionic/diamniotic, unspecified trimester: Secondary | ICD-10-CM

## 2013-09-12 NOTE — Progress Notes (Signed)
[facility] ultrasound  Indication: 43 yr old G1P0 at [redacted]w[redacted]d with monochorionic/diamniotic twin pregnancy for fetal ultrasound.  Findings: 1. Monochorionic/diamniotic twin gestation; the dividing membrane is seen. 2. Fetal biometry for both fetuses is consistent with dating; although there is a 8 day discrepancy between the fetuses; B smaller than A. 3. Anterior placenta without evidence of previa. 4. Normal amniotic fluid volume for both fetuses. 5. Normal transabdominal cervical length. 6. The limited anatomy survey for both fetuses is normal. 7. Normal stomach and bladder seen in each fetus.  Recommendations: 1. Appropriate fetal growth although discussed 24% discordance: - discussed this may be due to unequal placental sharing (most likely given normal fluid and bladders) or may be a sign of impending TTTS - recommend repeat growth in 3 weeks - recommend follow up in 1 week to screen for TTTS- no evidence on today's ultrasound 2. Monochorionic/diamniotic twin gestation: - previously counseled - recommend serial fetal growth ultrasounds every 2-4 weeks -  recommend complete fetal anatomic survey at 18-[redacted] weeks gestation and fetal echocardiogram - recommend antenatal testing starting at [redacted] weeks gestation; recommend delivery at 36-[redacted] weeks gestation if pregnancy remains uncomplicated - recommend surveillance at least every 2 weeks for TTTS  3. Advanced maternal age: - patient met with the genetic counselor; see separate report - after counseling patient declined further testing   With maternal age over 40 there is increased risk of gestational diabetes, fetal growth restriction, need for Cesarean delivery, and stillbirth. Recommend fetal surveillance as above  Kristen Quinn, MD 

## 2013-09-19 ENCOUNTER — Ambulatory Visit (HOSPITAL_COMMUNITY)
Admission: RE | Admit: 2013-09-19 | Discharge: 2013-09-19 | Disposition: A | Discharge status: Home / Self Care | Payer: Medicaid Other | Source: Ambulatory Visit | Attending: Family Medicine | Admitting: Family Medicine

## 2013-09-19 ENCOUNTER — Encounter (HOSPITAL_COMMUNITY): Payer: Self-pay

## 2013-09-19 DIAGNOSIS — O09529 Supervision of elderly multigravida, unspecified trimester: Secondary | ICD-10-CM | POA: Insufficient documentation

## 2013-09-19 DIAGNOSIS — O30031 Twin pregnancy, monochorionic/diamniotic, first trimester: Secondary | ICD-10-CM

## 2013-09-19 DIAGNOSIS — O30019 Twin pregnancy, monochorionic/monoamniotic, unspecified trimester: Secondary | ICD-10-CM | POA: Insufficient documentation

## 2013-09-19 DIAGNOSIS — IMO0002 Reserved for concepts with insufficient information to code with codable children: Secondary | ICD-10-CM

## 2013-09-19 DIAGNOSIS — O30009 Twin pregnancy, unspecified number of placenta and unspecified number of amniotic sacs, unspecified trimester: Secondary | ICD-10-CM | POA: Insufficient documentation

## 2013-09-20 ENCOUNTER — Encounter: Payer: Self-pay | Admitting: Family Medicine

## 2013-09-21 ENCOUNTER — Encounter: Payer: Self-pay | Admitting: Advanced Practice Midwife

## 2013-09-21 ENCOUNTER — Ambulatory Visit (INDEPENDENT_AMBULATORY_CARE_PROVIDER_SITE_OTHER): Payer: Medicaid Other | Admitting: Advanced Practice Midwife

## 2013-09-21 VITALS — BP 99/64 | HR 107 | Temp 97.7°F | Wt 112.1 lb

## 2013-09-21 DIAGNOSIS — O30009 Twin pregnancy, unspecified number of placenta and unspecified number of amniotic sacs, unspecified trimester: Secondary | ICD-10-CM

## 2013-09-21 DIAGNOSIS — O09529 Supervision of elderly multigravida, unspecified trimester: Secondary | ICD-10-CM

## 2013-09-21 DIAGNOSIS — IMO0002 Reserved for concepts with insufficient information to code with codable children: Secondary | ICD-10-CM

## 2013-09-21 DIAGNOSIS — O30031 Twin pregnancy, monochorionic/diamniotic, first trimester: Secondary | ICD-10-CM

## 2013-09-21 DIAGNOSIS — Z609 Problem related to social environment, unspecified: Secondary | ICD-10-CM

## 2013-09-21 DIAGNOSIS — Z603 Acculturation difficulty: Secondary | ICD-10-CM

## 2013-09-21 DIAGNOSIS — O30039 Twin pregnancy, monochorionic/diamniotic, unspecified trimester: Secondary | ICD-10-CM

## 2013-09-21 LAB — POCT URINALYSIS DIP (DEVICE)
BILIRUBIN URINE: NEGATIVE
Glucose, UA: NEGATIVE mg/dL
KETONES UR: NEGATIVE mg/dL
Nitrite: NEGATIVE
PH: 7 (ref 5.0–8.0)
PROTEIN: NEGATIVE mg/dL
Specific Gravity, Urine: 1.01 (ref 1.005–1.030)
Urobilinogen, UA: 0.2 mg/dL (ref 0.0–1.0)

## 2013-09-21 NOTE — Patient Instructions (Signed)
Second Trimester of Pregnancy The second trimester is from week 13 through week 28, months 4 through 6. The second trimester is often a time when you feel your best. Your body has also adjusted to being pregnant, and you begin to feel better physically. Usually, morning sickness has lessened or quit completely, you may have more energy, and you may have an increase in appetite. The second trimester is also a time when the fetus is growing rapidly. At the end of the sixth month, the fetus is about 9 inches long and weighs about 1 pounds. You will likely begin to feel the baby move (quickening) between 18 and 20 weeks of the pregnancy. BODY CHANGES Your body goes through many changes during pregnancy. The changes vary from woman to woman.   Your weight will continue to increase. You will notice your lower abdomen bulging out.  You may begin to get stretch marks on your hips, abdomen, and breasts.  You may develop headaches that can be relieved by medicines approved by your health care provider.  You may urinate more often because the fetus is pressing on your bladder.  You may develop or continue to have heartburn as a result of your pregnancy.  You may develop constipation because certain hormones are causing the muscles that push waste through your intestines to slow down.  You may develop hemorrhoids or swollen, bulging veins (varicose veins).  You may have back pain because of the weight gain and pregnancy hormones relaxing your joints between the bones in your pelvis and as a result of a shift in weight and the muscles that support your balance.  Your breasts will continue to grow and be tender.  Your gums may bleed and may be sensitive to brushing and flossing.  Dark spots or blotches (chloasma, mask of pregnancy) may develop on your face. This will likely fade after the baby is born.  A dark line from your belly button to the pubic area (linea nigra) may appear. This will likely fade  after the baby is born.  You may have changes in your hair. These can include thickening of your hair, rapid growth, and changes in texture. Some women also have hair loss during or after pregnancy, or hair that feels dry or thin. Your hair will most likely return to normal after your baby is born. WHAT TO EXPECT AT YOUR PRENATAL VISITS During a routine prenatal visit:  You will be weighed to make sure you and the fetus are growing normally.  Your blood pressure will be taken.  Your abdomen will be measured to track your baby's growth.  The fetal heartbeat will be listened to.  Any test results from the previous visit will be discussed. Your health care provider may ask you:  How you are feeling.  If you are feeling the baby move.  If you have had any abnormal symptoms, such as leaking fluid, bleeding, severe headaches, or abdominal cramping.  If you have any questions. Other tests that may be performed during your second trimester include:  Blood tests that check for:  Low iron levels (anemia).  Gestational diabetes (between 24 and 28 weeks).  Rh antibodies.  Urine tests to check for infections, diabetes, or protein in the urine.  An ultrasound to confirm the proper growth and development of the baby.  An amniocentesis to check for possible genetic problems.  Fetal screens for spina bifida and Down syndrome. HOME CARE INSTRUCTIONS   Avoid all smoking, herbs, alcohol, and unprescribed   drugs. These chemicals affect the formation and growth of the baby.  Follow your health care provider's instructions regarding medicine use. There are medicines that are either safe or unsafe to take during pregnancy.  Exercise only as directed by your health care provider. Experiencing uterine cramps is a good sign to stop exercising.  Continue to eat regular, healthy meals.  Wear a good support bra for breast tenderness.  Do not use hot tubs, steam rooms, or saunas.  Wear your  seat belt at all times when driving.  Avoid raw meat, uncooked cheese, cat litter boxes, and soil used by cats. These carry germs that can cause birth defects in the baby.  Take your prenatal vitamins.  Try taking a stool softener (if your health care provider approves) if you develop constipation. Eat more high-fiber foods, such as fresh vegetables or fruit and whole grains. Drink plenty of fluids to keep your urine clear or pale yellow.  Take warm sitz baths to soothe any pain or discomfort caused by hemorrhoids. Use hemorrhoid cream if your health care provider approves.  If you develop varicose veins, wear support hose. Elevate your feet for 15 minutes, 3-4 times a day. Limit salt in your diet.  Avoid heavy lifting, wear low heel shoes, and practice good posture.  Rest with your legs elevated if you have leg cramps or low back pain.  Visit your dentist if you have not gone yet during your pregnancy. Use a soft toothbrush to brush your teeth and be gentle when you floss.  A sexual relationship may be continued unless your health care provider directs you otherwise.  Continue to go to all your prenatal visits as directed by your health care provider. SEEK MEDICAL CARE IF:   You have dizziness.  You have mild pelvic cramps, pelvic pressure, or nagging pain in the abdominal area.  You have persistent nausea, vomiting, or diarrhea.  You have a bad smelling vaginal discharge.  You have pain with urination. SEEK IMMEDIATE MEDICAL CARE IF:   You have a fever.  You are leaking fluid from your vagina.  You have spotting or bleeding from your vagina.  You have severe abdominal cramping or pain.  You have rapid weight gain or loss.  You have shortness of breath with chest pain.  You notice sudden or extreme swelling of your face, hands, ankles, feet, or legs.  You have not felt your baby move in over an hour.  You have severe headaches that do not go away with  medicine.  You have vision changes. Document Released: 03/04/2001 Document Revised: 03/15/2013 Document Reviewed: 05/11/2012 Exit[employer] Patient Information 2015 [employer]. This information is not intended to replace advice given to you by your health care provider. Make sure you discuss any questions you have with your health care provider.  

## 2013-09-21 NOTE — Progress Notes (Signed)
C/o pain in left lower leg and above knee for 2 days- severe at times.

## 2013-09-21 NOTE — Progress Notes (Signed)
Doing well, some headaches. Told by pharmacy to only use Tylenol 325mg  once per day. Informed Jill [name] can take 650mg  q 6 hrs PRN. Per MFM, needs surveillance and US every 2 wks.  Already has small discrepancy.

## 2013-09-22 ENCOUNTER — Other Ambulatory Visit: Payer: Self-pay | Admitting: Family Medicine

## 2013-09-22 DIAGNOSIS — O09519 Supervision of elderly primigravida, unspecified trimester: Secondary | ICD-10-CM

## 2013-09-22 DIAGNOSIS — O30009 Twin pregnancy, unspecified number of placenta and unspecified number of amniotic sacs, unspecified trimester: Secondary | ICD-10-CM

## 2013-09-27 ENCOUNTER — Ambulatory Visit (HOSPITAL_COMMUNITY)
Admission: RE | Admit: 2013-09-27 | Discharge: 2013-09-27 | Disposition: A | Discharge status: Home / Self Care | Payer: Medicaid Other | Source: Ambulatory Visit | Attending: Family Medicine | Admitting: Family Medicine

## 2013-09-27 ENCOUNTER — Encounter: Payer: Self-pay | Admitting: Family Medicine

## 2013-09-27 DIAGNOSIS — O30009 Twin pregnancy, unspecified number of placenta and unspecified number of amniotic sacs, unspecified trimester: Secondary | ICD-10-CM | POA: Insufficient documentation

## 2013-09-27 DIAGNOSIS — O09519 Supervision of elderly primigravida, unspecified trimester: Secondary | ICD-10-CM

## 2013-09-27 DIAGNOSIS — Z3689 Encounter for other specified antenatal screening: Secondary | ICD-10-CM | POA: Diagnosis not present

## 2013-10-04 ENCOUNTER — Other Ambulatory Visit: Payer: Self-pay | Admitting: Family Medicine

## 2013-10-04 DIAGNOSIS — O09512 Supervision of elderly primigravida, second trimester: Secondary | ICD-10-CM

## 2013-10-04 DIAGNOSIS — O30009 Twin pregnancy, unspecified number of placenta and unspecified number of amniotic sacs, unspecified trimester: Secondary | ICD-10-CM

## 2013-10-05 ENCOUNTER — Encounter (HOSPITAL_COMMUNITY): Payer: Self-pay

## 2013-10-05 ENCOUNTER — Ambulatory Visit (HOSPITAL_COMMUNITY)
Admission: RE | Admit: 2013-10-05 | Discharge: 2013-10-05 | Disposition: A | Discharge status: Home / Self Care | Payer: Medicaid Other | Source: Ambulatory Visit | Attending: Obstetrics & Gynecology | Admitting: Obstetrics & Gynecology

## 2013-10-05 DIAGNOSIS — O30009 Twin pregnancy, unspecified number of placenta and unspecified number of amniotic sacs, unspecified trimester: Secondary | ICD-10-CM

## 2013-10-05 DIAGNOSIS — O09512 Supervision of elderly primigravida, second trimester: Secondary | ICD-10-CM

## 2013-10-05 DIAGNOSIS — O09529 Supervision of elderly multigravida, unspecified trimester: Secondary | ICD-10-CM | POA: Diagnosis not present

## 2013-10-06 ENCOUNTER — Other Ambulatory Visit: Payer: Self-pay | Admitting: Family Medicine

## 2013-10-06 ENCOUNTER — Ambulatory Visit (INDEPENDENT_AMBULATORY_CARE_PROVIDER_SITE_OTHER): Payer: Medicaid Other | Admitting: Obstetrics & Gynecology

## 2013-10-06 VITALS — BP 108/72 | HR 100 | Wt 114.0 lb

## 2013-10-06 DIAGNOSIS — O30039 Twin pregnancy, monochorionic/diamniotic, unspecified trimester: Secondary | ICD-10-CM

## 2013-10-06 DIAGNOSIS — O30009 Twin pregnancy, unspecified number of placenta and unspecified number of amniotic sacs, unspecified trimester: Secondary | ICD-10-CM

## 2013-10-06 DIAGNOSIS — O30031 Twin pregnancy, monochorionic/diamniotic, first trimester: Secondary | ICD-10-CM

## 2013-10-06 DIAGNOSIS — O09519 Supervision of elderly primigravida, unspecified trimester: Secondary | ICD-10-CM

## 2013-10-06 LAB — POCT URINALYSIS DIP (DEVICE)
Bilirubin Urine: NEGATIVE
Glucose, UA: NEGATIVE mg/dL
Ketones, ur: NEGATIVE mg/dL
Nitrite: NEGATIVE
PROTEIN: NEGATIVE mg/dL
Specific Gravity, Urine: 1.01 (ref 1.005–1.030)
UROBILINOGEN UA: 0.2 mg/dL (ref 0.0–1.0)
pH: 7 (ref 5.0–8.0)

## 2013-10-06 NOTE — Progress Notes (Signed)
Patient reports yellow discharge with itching.

## 2013-10-06 NOTE — Patient Instructions (Signed)
Multiple Pregnancy A multiple pregnancy is when a woman is pregnant with two or more fetuses. Multiple pregnancies occur in about 3% of all births. The more babies in a pregnancy, the greater the risks of problems to the babies and mother. This includes death. Since the use of Assisted Reproductive Technology (ART) and medications that can induce ovulation, multiple fetal gestation has increased.  RISKS TO THE MOTHER  Preeclampsia and eclampsia.  Postpartum bleeding (hemorrhage).  Kidney infection (pyelonephritis).  Develop anemia.  Develop diabetes.  Liver complications.  A blood clot blocks the artery, or branch of the artery leading to the lungs (pulmonary embolism).  Blood clots in the leg.  Placental separation.  Higher rate of Cesarean Section deliveries.  Women over 35 years old have a higher rate of Downs Syndrome babies. RISKS TO THE BABY  Preterm labor with a premature baby.  Very low birth weight babies that are less than 3 pounds, especially with triplets or mores.  Premature rupture of the membranes.  Twin to twin blood transfusion with one baby anemic and the other baby with too much blood in its system. There may also be heart failure.  With triplets or more, one of the babies is at high risk for cerebral palsy or other neurologic problem.  There is a higher incidence of fetal death. CARE OF MOTHERS WITH MULTIPLE FETAL GESTATION Multiple pregnancies need more care and special prenatal care.  You will see your caregiver more often.  You will have more tests including ultrasounds, nonstress tests and blood tests.  You will have special tests done called amniocentesis and a biophysical profile.  You may be hospitalized more often during the pregnancy.  You will be encouraged to eat a balanced and healthy diet with vitamin and mineral supplements as directed.  You will be asked to get more rest and sleep to keep up your energy.  You will be asked to  restrict your daily activities, exercise, work, household chores and sexual activity.  If you have preterm labor with small babies, you will be given a steroid injection to help the babies lungs mature and do better when born.  The delivery may have to be by Cesarean delivery, especially if there are triplets or more.  The delivery should be in a hospital with an intensive care nursery and Neonatologists (pediatrician for high risk babies) to care for the newborn babies. HOME CARE INSTRUCTIONS   Follow the caregiver's recommendations regarding office visits, tests for you and the babies, diet, rest and medications.  Avoid a large amount of physical activity.  Arrange to have help after the babies are born and when you go home from the hospital.  Take classes on how to care for multiple babies before you deliver them. SEEK IMMEDIATE MEDICAL CARE IF:   You develop a temperature of 102 F (38.9 C) or higher.  You are leaking fluid from the vagina.  You develop vaginal bleeding.  You develop uterine contractions.  You develop a severe headache, severe upper abdominal pain, visual problems or excessive swelling of your face, hands and feet.  You develop severe back pain or leg pain.  You develop severe tiredness.  You develop chest pain.  You have shortness of breath, fall down or pass out. Document Released: 12/18/2007 Document Revised: 06/02/2011 Document Reviewed: 02/11/2013 ExitCare Patient Information 2015 [employer]. This information is not intended to replace advice given to you by your health care provider. Make sure you discuss any questions you have with   your health care provider.  

## 2013-10-06 NOTE — Progress Notes (Signed)
Will have f/u US in 2 weeks, there is less concern re. TTTS currently.

## 2013-10-12 ENCOUNTER — Inpatient Hospital Stay (HOSPITAL_COMMUNITY)
Admission: AD | Admit: 2013-10-12 | Discharge: 2013-10-12 | Disposition: A | Discharge status: Home / Self Care | Payer: Medicaid Other | Source: Ambulatory Visit | Attending: Obstetrics & Gynecology | Admitting: Obstetrics & Gynecology

## 2013-10-12 ENCOUNTER — Encounter (HOSPITAL_COMMUNITY): Payer: Self-pay | Admitting: *Deleted

## 2013-10-12 DIAGNOSIS — IMO0002 Reserved for concepts with insufficient information to code with codable children: Secondary | ICD-10-CM

## 2013-10-12 DIAGNOSIS — O30009 Twin pregnancy, unspecified number of placenta and unspecified number of amniotic sacs, unspecified trimester: Secondary | ICD-10-CM | POA: Insufficient documentation

## 2013-10-12 DIAGNOSIS — O30031 Twin pregnancy, monochorionic/diamniotic, first trimester: Secondary | ICD-10-CM

## 2013-10-12 DIAGNOSIS — Z603 Acculturation difficulty: Secondary | ICD-10-CM

## 2013-10-12 DIAGNOSIS — O09519 Supervision of elderly primigravida, unspecified trimester: Secondary | ICD-10-CM | POA: Diagnosis not present

## 2013-10-12 DIAGNOSIS — O99891 Other specified diseases and conditions complicating pregnancy: Secondary | ICD-10-CM | POA: Insufficient documentation

## 2013-10-12 DIAGNOSIS — O2650 Maternal hypotension syndrome, unspecified trimester: Secondary | ICD-10-CM | POA: Insufficient documentation

## 2013-10-12 DIAGNOSIS — O26899 Other specified pregnancy related conditions, unspecified trimester: Secondary | ICD-10-CM

## 2013-10-12 DIAGNOSIS — O30039 Twin pregnancy, monochorionic/diamniotic, unspecified trimester: Secondary | ICD-10-CM | POA: Insufficient documentation

## 2013-10-12 DIAGNOSIS — O9989 Other specified diseases and conditions complicating pregnancy, childbirth and the puerperium: Secondary | ICD-10-CM

## 2013-10-12 DIAGNOSIS — O3440 Maternal care for other abnormalities of cervix, unspecified trimester: Secondary | ICD-10-CM | POA: Insufficient documentation

## 2013-10-12 DIAGNOSIS — R109 Unspecified abdominal pain: Secondary | ICD-10-CM

## 2013-10-12 LAB — CBC WITH DIFFERENTIAL/PLATELET
BASOS PCT: 0 % (ref 0–1)
Basophils Absolute: 0 10*3/uL (ref 0.0–0.1)
Eosinophils Absolute: 0 10*3/uL (ref 0.0–0.7)
Eosinophils Relative: 0 % (ref 0–5)
HEMATOCRIT: 30.3 % — AB (ref 36.0–46.0)
HEMOGLOBIN: 10.5 g/dL — AB (ref 12.0–15.0)
LYMPHS ABS: 1.4 10*3/uL (ref 0.7–4.0)
Lymphocytes Relative: 11 % — ABNORMAL LOW (ref 12–46)
MCH: 32.3 pg (ref 26.0–34.0)
MCHC: 34.7 g/dL (ref 30.0–36.0)
MCV: 93.2 fL (ref 78.0–100.0)
MONO ABS: 0.4 10*3/uL (ref 0.1–1.0)
MONOS PCT: 3 % (ref 3–12)
NEUTROS ABS: 11.3 10*3/uL — AB (ref 1.7–7.7)
Neutrophils Relative %: 86 % — ABNORMAL HIGH (ref 43–77)
Platelets: 182 10*3/uL (ref 150–400)
RBC: 3.25 MIL/uL — ABNORMAL LOW (ref 3.87–5.11)
RDW: 14 % (ref 11.5–15.5)
WBC: 13.1 10*3/uL — ABNORMAL HIGH (ref 4.0–10.5)

## 2013-10-12 LAB — URINALYSIS, ROUTINE W REFLEX MICROSCOPIC
BILIRUBIN URINE: NEGATIVE
GLUCOSE, UA: NEGATIVE mg/dL
KETONES UR: NEGATIVE mg/dL
Nitrite: NEGATIVE
PH: 7 (ref 5.0–8.0)
PROTEIN: NEGATIVE mg/dL
Specific Gravity, Urine: 1.005 (ref 1.005–1.030)
Urobilinogen, UA: 0.2 mg/dL (ref 0.0–1.0)

## 2013-10-12 LAB — COMPREHENSIVE METABOLIC PANEL
ALBUMIN: 2.5 g/dL — AB (ref 3.5–5.2)
ALT: 9 U/L (ref 0–35)
AST: 15 U/L (ref 0–37)
Alkaline Phosphatase: 64 U/L (ref 39–117)
Anion gap: 11 (ref 5–15)
BUN: 6 mg/dL (ref 6–23)
CHLORIDE: 103 meq/L (ref 96–112)
CO2: 23 mEq/L (ref 19–32)
CREATININE: 0.5 mg/dL (ref 0.50–1.10)
Calcium: 8.6 mg/dL (ref 8.4–10.5)
GFR calc Af Amer: >90 mL/min (ref >90)
Glucose, Bld: 102 mg/dL — ABNORMAL HIGH (ref 70–99)
Potassium: 3.6 mEq/L — ABNORMAL LOW (ref 3.7–5.3)
Sodium: 137 mEq/L (ref 137–147)
Total Bilirubin: <0.2 mg/dL — ABNORMAL LOW (ref 0.3–1.2)
Total Protein: 5.7 g/dL — ABNORMAL LOW (ref 6.0–8.3)

## 2013-10-12 LAB — URINE MICROSCOPIC-ADD ON

## 2013-10-12 LAB — WET PREP, GENITAL
Clue Cells Wet Prep HPF POC: NONE SEEN
Trich, Wet Prep: NONE SEEN
Yeast Wet Prep HPF POC: NONE SEEN

## 2013-10-12 MED ORDER — LACTATED RINGERS IV BOLUS (SEPSIS): 1000.0000 mL | Freq: Once | INTRAVENOUS | Status: DC

## 2013-10-12 NOTE — MAU Note (Signed)
Was at office visit, getting US/fetal echo and passed out.  Pt states she passed out because she did not sleep last night- was here from 0300-0600.

## 2013-10-12 NOTE — Discharge Instructions (Signed)
Syncope °Syncope means a person passes out (faints). The person usually wakes up in less than 5 minutes. It is important to seek medical care for syncope. °HOME CARE °· Have someone stay with you until you feel normal. °· Do not drive, use machines, or play sports until your doctor says it is okay. °· Keep all doctor visits as told. °· Lie down when you feel like you might pass out. Take deep breaths. Wait until you feel normal before standing up. °· Drink enough fluids to keep your pee (urine) clear or pale yellow. °· If you take blood pressure or heart medicine, get up slowly. Take several minutes to sit and then stand. °GET HELP RIGHT AWAY IF:  °· You have a severe headache. °· You have pain in the chest, belly (abdomen), or back. °· You are bleeding from the mouth or butt (rectum). °· You have black or tarry poop (stool). °· You have an irregular or very fast heartbeat. °· You have pain with breathing. °· You keep passing out, or you have shaking (seizures) when you pass out. °· You pass out when sitting or lying down. °· You feel confused. °· You have trouble walking. °· You have severe weakness. °· You have vision problems. °If you fainted, call your local emergency services (911 in [place] Do not drive yourself to the hospital. °MAKE SURE YOU:  °· Understand these instructions. °· Will watch your condition. °· Will get help right away if you are not doing well or get worse. °Document Released: 08/27/2007 Document Revised: 09/09/2011 Document Reviewed: 05/09/2011 °ExitCare® Patient Information ©2015 [employer]. This information is not intended to replace advice given to you by your health care provider. Make sure you discuss any questions you have with your health care provider. ° °

## 2013-10-12 NOTE — MAU Note (Addendum)
Pt stated she  Has been having pain in her left side for 3 days now but has gotten worse the past few hours. Pain is cramping and comes and goes. Also reports she has not felt one of the babys move. Pt also c/o SOB since about 2am

## 2013-10-12 NOTE — MAU Note (Signed)
Chief Complaint:  Abdominal Pain   None     HPI: Jill [name] is a 43 y.o. G1P0 at [id number] who presents to maternity admissions reporting left side abdominal pain. Patient has noticed intermittent tight achy to sharp left side abdominal pain for the past 5 days that she has been able to control with change of position. This morning she awoke from sleep with upper left sided abdominal pain that was more intense than previous presentations and did not immediately respond to change in position. She noted associated increase in pain with left side lying, leaning forward while seated and deep breathing. Pain slowly resolved over the course of an hour and had spontaneously resolved by the time of examination. Patient states that rubbing her side and sitting in a semi reclined position helped resolve pain. Patient denies contractions, leakage of fluid or vaginal bleeding, vaginal discharge, nausea, vomiting, fever, chills. Good fetal movement.   Pregnancy Course:   Past Medical History: Past Medical History  Diagnosis Date  . Medical history non-contributory   . Polyp of cervix     Past obstetric history: OB History  Gravida Para Term Preterm AB SAB TAB Ectopic Multiple Living  1             # Outcome Date GA Lbr Len/2nd Weight Sex Delivery Anes PTL Lv  1 CUR               Past Surgical History: Past Surgical History  Procedure Laterality Date  . No past surgeries       Family History: Family History  Problem Relation Age of Onset  . Diabetes Mother   . Heart disease Mother     Social History: History  Substance Use Topics  . Smoking status: Never Smoker   . Smokeless tobacco: Never Used  . Alcohol Use: No    Allergies: No Known Allergies  Meds:  Prescriptions prior to admission  Medication Sig Dispense Refill  . acetaminophen (TYLENOL) 325 MG tablet Take 2 tablets (650 mg total) by mouth once.  30 tablet  0  . doxylamine, Sleep, (UNISOM) 25 MG tablet Take 25 mg by  mouth at bedtime.      . folic acid (FOLVITE) 1 MG tablet Take 1 tablet (1 mg total) by mouth daily.  30 tablet  6  . Multiple Vitamin (DAILY VALUE MULTIVITAMIN) TABS Take 1 tablet by mouth daily.  30 tablet  6  . Prenatal Vit-Fe Fumarate-FA (PRENATAL VITAMINS) 28-0.8 MG TABS Take 1 tablet by mouth daily.  30 tablet  10  . promethazine (PHENERGAN) 25 MG tablet Take 0.5 tablets (12.5 mg total) by mouth every 6 (six) hours as needed for nausea or vomiting.  30 tablet  0  . pyridOXINE (VITAMIN B-6) 100 MG tablet Take 100 mg by mouth daily. States she takes half a pill in morning and other half at night.      . Vitamin D, Ergocalciferol, (DRISDOL) 50000 UNITS CAPS capsule Take 1 capsule (50,000 Units total) by mouth every 7 (seven) days.  12 capsule  0    ROS: Pertinent findings in history of present illness.  Physical Exam  Blood pressure 105/64, pulse 79, temperature 98 F (36.7 C), temperature source Oral, resp. rate 18, last menstrual period 05/19/2013, SpO2 99.00%. GENERAL: Well-developed, well-nourished female in no acute distress.  HEENT: normocephalic HEART: normal rate and rhythm, no murmurs rubs or gallops RESP: normal effort, clear to auscultation bilateral ABDOMEN: Soft, mild ULQ tenderness, gravid appropriate for gestational  age EXTREMITIES: Nontender, no edema, 2+ equal distal pulses NEURO: alert and oriented SPECULUM EXAM: NEFG, physiologic discharge, no blood, cervix clean    FHT:  Baseline 145 BPM, moderate variability, accelerations present, no decelerations Contractions: None   Labs: Results for orders placed during the hospital encounter of 10/12/13 (from the past 24 hour(s))  URINALYSIS, ROUTINE W REFLEX MICROSCOPIC     Status: Abnormal   Collection Time    10/12/13  4:05 AM      Result Value Ref Range   Color, Urine YELLOW  YELLOW   APPearance CLEAR  CLEAR   Specific Gravity, Urine 1.005  1.005 - 1.030   pH 7.0  5.0 - 8.0   Glucose, UA NEGATIVE  NEGATIVE mg/dL    Hgb urine dipstick LARGE (*) NEGATIVE   Bilirubin Urine NEGATIVE  NEGATIVE   Ketones, ur NEGATIVE  NEGATIVE mg/dL   Protein, ur NEGATIVE  NEGATIVE mg/dL   Urobilinogen, UA 0.2  0.0 - 1.0 mg/dL   Nitrite NEGATIVE  NEGATIVE   Leukocytes, UA MODERATE (*) NEGATIVE  URINE MICROSCOPIC-ADD ON     Status: Abnormal   Collection Time    10/12/13  4:05 AM      Result Value Ref Range   Squamous Epithelial / LPF FEW (*) RARE   WBC, UA 7-10  <3 WBC/hpf   RBC / HPF 3-6  <3 RBC/hpf   Bacteria, UA FEW (*) RARE  WET PREP, GENITAL     Status: Abnormal   Collection Time    10/12/13  5:45 AM      Result Value Ref Range   Yeast Wet Prep HPF POC NONE SEEN  NONE SEEN   Trich, Wet Prep NONE SEEN  NONE SEEN   Clue Cells Wet Prep HPF POC NONE SEEN  NONE SEEN   WBC, Wet Prep HPF POC MANY (*) NONE SEEN    MAU Course:   Assessment: Abdominal pain in pregnancy Normal Exam  Plan Reviewed labor precautions and fetal kick counts. D/C home, patient will F/U in clinic.    Medication List    ASK your doctor about these medications       acetaminophen 325 MG tablet  Commonly known as:  TYLENOL  Take 2 tablets (650 mg total) by mouth once.     DAILY VALUE MULTIVITAMIN Tabs  Take 1 tablet by mouth daily.     doxylamine (Sleep) 25 MG tablet  Commonly known as:  UNISOM  Take 25 mg by mouth at bedtime.     folic acid 1 MG tablet  Commonly known as:  FOLVITE  Take 1 tablet (1 mg total) by mouth daily.     Prenatal Vitamins 28-0.8 MG Tabs  Take 1 tablet by mouth daily.     promethazine 25 MG tablet  Commonly known as:  PHENERGAN  Take 0.5 tablets (12.5 mg total) by mouth every 6 (six) hours as needed for nausea or vomiting.     pyridOXINE 100 MG tablet  Commonly known as:  VITAMIN B-6  Take 100 mg by mouth daily. States she takes half a pill in morning and other half at night.     Vitamin D (Ergocalciferol) 50000 UNITS Caps capsule  Commonly known as:  DRISDOL  Take 1 capsule (50,000 Units  total) by mouth every 7 (seven) days.        Annita Brod, Student-PA 10/12/2013 6:33 AM

## 2013-10-12 NOTE — MAU Provider Note (Signed)
Chief Complaint:  Loss of Consciousness   First Provider Initiated Contact with Patient 10/12/13 1712      HPI: Jill [name] is a 43 y.o. G1P0 at 6650w6d who presents to maternity admissions reporting syncope.  Last PM pt presented to L&D triage with L sided abdominal pain. Work up was negative. She went home late (~6am), did not sleep and then went to scheduled fetal echo this AM. She reports being very tired and not eating, drinking much.  During fetal echo, she reports she did not feel well and husband reported she "passed out." He claims she lost consciousness for 10-15 minutes, but also reports she was aware of her surroundings during this and was verbal, but was just "weak" and "limp." Denies tonic-clonic movements, loss of urine, loss of stool. She apparently regained consciousness when she sat straight up, but became nauseated and started vomiting. EMS was then called and gave her 4 mg zofran and a 500 cc LR bolus.   Denies contractions, leakage of fluid or vaginal bleeding. Good fetal movement.   Pregnancy Course:   Past Medical History: Past Medical History  Diagnosis Date  . Medical history non-contributory   . Polyp of cervix     Past obstetric history: OB History  Gravida Para Term Preterm AB SAB TAB Ectopic Multiple Living  1             # Outcome Date GA Lbr Len/2nd Weight Sex Delivery Anes PTL Lv  1 CUR               Past Surgical History: Past Surgical History  Procedure Laterality Date  . No past surgeries       Family History: Family History  Problem Relation Age of Onset  . Diabetes Mother   . Heart disease Mother     Social History: History  Substance Use Topics  . Smoking status: Never Smoker   . Smokeless tobacco: Never Used  . Alcohol Use: No    Allergies: No Known Allergies  Meds:  Prescriptions prior to admission  Medication Sig Dispense Refill  . acetaminophen (TYLENOL) 325 MG tablet Take 2 tablets (650 mg total) by mouth once.   30 tablet  0  . doxylamine, Sleep, (UNISOM) 25 MG tablet Take 25 mg by mouth at bedtime.      . folic acid (FOLVITE) 1 MG tablet Take 1 tablet (1 mg total) by mouth daily.  30 tablet  6  . Multiple Vitamin (DAILY VALUE MULTIVITAMIN) TABS Take 1 tablet by mouth daily.  30 tablet  6  . Prenatal Vit-Fe Fumarate-FA (PRENATAL VITAMINS) 28-0.8 MG TABS Take 1 tablet by mouth daily.  30 tablet  10  . pyridOXINE (VITAMIN B-6) 100 MG tablet Take 100 mg by mouth daily. States she takes half a pill in morning and other half at night.      . Vitamin D, Ergocalciferol, (DRISDOL) 50000 UNITS CAPS capsule Take 1 capsule (50,000 Units total) by mouth every 7 (seven) days.  12 capsule  0  . promethazine (PHENERGAN) 25 MG tablet Take 0.5 tablets (12.5 mg total) by mouth every 6 (six) hours as needed for nausea or vomiting.  30 tablet  0    ROS: Pertinent findings in history of present illness.  Physical Exam  Blood pressure 96/51, pulse 77, temperature 98.1 F (36.7 C), temperature source Oral, resp. rate 16, last menstrual period 05/19/2013, SpO2 99.00%. GENERAL: Well-developed, well-nourished female in no acute distress. Pale. HEENT: normocephalic HEART: normal rate  RESP: normal effort ABDOMEN: Soft, non-tender, gravid appropriate for gestational age EXTREMITIES: Nontender, no edema NEURO: alert and oriented SPECULUM EXAM: NEFG, physiologic discharge, no blood, cervix clean    FHT:  Baseline 140s for baby A and B , moderate variability, accelerations present, no decelerations Contractions: none   Labs: Results for orders placed during the hospital encounter of 10/12/13 (from the past 24 hour(s))  CBC WITH DIFFERENTIAL     Status: Abnormal   Collection Time    10/12/13  4:23 PM      Result Value Ref Range   WBC 13.1 (*) 4.0 - 10.5 K/uL   RBC 3.25 (*) 3.87 - 5.11 MIL/uL   Hemoglobin 10.5 (*) 12.0 - 15.0 g/dL   HCT 16.1 (*) 09.6 - 04.5 %   MCV 93.2  78.0 - 100.0 fL   MCH 32.3  26.0 - 34.0 pg    MCHC 34.7  30.0 - 36.0 g/dL   RDW 40.9  81.1 - 91.4 %   Platelets 182  150 - 400 K/uL   Neutrophils Relative % 86 (*) 43 - 77 %   Neutro Abs 11.3 (*) 1.7 - 7.7 K/uL   Lymphocytes Relative 11 (*) 12 - 46 %   Lymphs Abs 1.4  0.7 - 4.0 K/uL   Monocytes Relative 3  3 - 12 %   Monocytes Absolute 0.4  0.1 - 1.0 K/uL   Eosinophils Relative 0  0 - 5 %   Eosinophils Absolute 0.0  0.0 - 0.7 K/uL   Basophils Relative 0  0 - 1 %   Basophils Absolute 0.0  0.0 - 0.1 K/uL  COMPREHENSIVE METABOLIC PANEL     Status: Abnormal   Collection Time    10/12/13  4:23 PM      Result Value Ref Range   Sodium 137  137 - 147 mEq/L   Potassium 3.6 (*) 3.7 - 5.3 mEq/L   Chloride 103  96 - 112 mEq/L   CO2 23  19 - 32 mEq/L   Glucose, Bld 102 (*) 70 - 99 mg/dL   BUN 6  6 - 23 mg/dL   Creatinine, Ser 7.82  0.50 - 1.10 mg/dL   Calcium 8.6  8.4 - 95.6 mg/dL   Total Protein 5.7 (*) 6.0 - 8.3 g/dL   Albumin 2.5 (*) 3.5 - 5.2 g/dL   AST 15  0 - 37 U/L   ALT 9  0 - 35 U/L   Alkaline Phosphatase 64  39 - 117 U/L   Total Bilirubin <0.2 (*) 0.3 - 1.2 mg/dL   GFR calc non Af Amer >90  >90 mL/min   GFR calc Af Amer >90  >90 mL/min   Anion gap 11  5 - 15    Imaging:  US Ob Limited  09/27/2013   OBSTETRICAL ULTRASOUND: This exam was performed within a [facility]. The OB US report was generated in the AS system, and faxed to the ordering physician.   This report is available in the [employer]. See the AS Obstetric US report via the Image Link.  US Ob Limited  09/19/2013   OBSTETRICAL ULTRASOUND: This exam was performed within a [facility]. The OB US report was generated in the AS system, and faxed to the ordering physician.   This report is available in the [employer]. See the AS Obstetric US report via the Image Link.  US Ob Follow Up  10/05/2013   OBSTETRICAL ULTRASOUND: This exam  was performed within a [facility]. The OB US report was generated in  the AS system, and faxed to the ordering physician.   This report is available in the [employer]. See the AS Obstetric US report via the Image Link.  US Ob Follow Up Addl Gest  10/05/2013   OBSTETRICAL ULTRASOUND: This exam was performed within a [facility]. The OB US report was generated in the AS system, and faxed to the ordering physician.   This report is available in the [employer]. See the AS Obstetric US report via the Image Link.  MAU Course:  Pt was observed for several hours in MAU where she received a total of 1.5 L LR bolus. She felt much improved and was able to tolerate PO without nausea or vomiting. Orthostatics were performed which were negative. CBC and CMP were WNL, with Hgb of 10.5. She also has EKG which was WNL. Given improvement and resolution of symtpoms, she was dishcarged home with instructions to rest and inc PO intake.   Assessment: 1. Language barrier, cultural differences   2. Advanced maternal age, antepartum condition or complication   3. Monochorionic diamniotic twin pregnancy in first trimester     Plan: Discharge home Labor precautions and fetal kick counts    Medication List    ASK your doctor about these medications       acetaminophen 325 MG tablet  Commonly known as:  TYLENOL  Take 2 tablets (650 mg total) by mouth once.     DAILY VALUE MULTIVITAMIN Tabs  Take 1 tablet by mouth daily.     doxylamine (Sleep) 25 MG tablet  Commonly known as:  UNISOM  Take 25 mg by mouth at bedtime.     folic acid 1 MG tablet  Commonly known as:  FOLVITE  Take 1 tablet (1 mg total) by mouth daily.     Prenatal Vitamins 28-0.8 MG Tabs  Take 1 tablet by mouth daily.     promethazine 25 MG tablet  Commonly known as:  PHENERGAN  Take 0.5 tablets (12.5 mg total) by mouth every 6 (six) hours as needed for nausea or vomiting.     pyridOXINE 100 MG tablet  Commonly known as:  VITAMIN B-6  Take 100 mg by mouth daily. States she takes  half a pill in morning and other half at night.     Vitamin D (Ergocalciferol) 50000 UNITS Caps capsule  Commonly known as:  DRISDOL  Take 1 capsule (50,000 Units total) by mouth every 7 (seven) days.        Ethelda Chick, MD 10/12/2013 5:18 PM

## 2013-10-12 NOTE — Discharge Instructions (Signed)

## 2013-10-13 NOTE — MAU Provider Note (Signed)
Attestation of Attending Supervision of Advanced Practitioner (PA/CNM/NP): Evaluation and management procedures were performed by the Advanced Practitioner under my supervision and collaboration.  I have reviewed the Advanced Practitioner's note and chart, and I agree with the management and plan.  Ashika Apuzzo, MD, FACOG Attending Obstetrician & Gynecologist Faculty Practice, [facility] -    

## 2013-10-13 NOTE — MAU Note (Signed)
I examined pt and agree with documentation above and PA-S plan of care. Jesusa Stenerson N Karim, CNM  

## 2013-10-19 ENCOUNTER — Encounter (HOSPITAL_COMMUNITY): Payer: Self-pay

## 2013-10-19 ENCOUNTER — Ambulatory Visit (HOSPITAL_COMMUNITY)
Admission: RE | Admit: 2013-10-19 | Discharge: 2013-10-19 | Disposition: A | Discharge status: Home / Self Care | Payer: Medicaid Other | Source: Ambulatory Visit | Attending: Obstetrics & Gynecology | Admitting: Obstetrics & Gynecology

## 2013-10-19 DIAGNOSIS — O30009 Twin pregnancy, unspecified number of placenta and unspecified number of amniotic sacs, unspecified trimester: Secondary | ICD-10-CM | POA: Diagnosis not present

## 2013-10-19 DIAGNOSIS — O09519 Supervision of elderly primigravida, unspecified trimester: Secondary | ICD-10-CM | POA: Insufficient documentation

## 2013-10-19 DIAGNOSIS — IMO0001 Reserved for inherently not codable concepts without codable children: Secondary | ICD-10-CM | POA: Diagnosis not present

## 2013-10-27 ENCOUNTER — Encounter: Payer: Self-pay | Admitting: Obstetrics & Gynecology

## 2013-10-27 ENCOUNTER — Ambulatory Visit (INDEPENDENT_AMBULATORY_CARE_PROVIDER_SITE_OTHER): Payer: Medicaid Other | Admitting: Obstetrics & Gynecology

## 2013-10-27 VITALS — BP 100/64 | HR 98 | Temp 98.2°F | Wt 116.7 lb

## 2013-10-27 DIAGNOSIS — O3680X0 Pregnancy with inconclusive fetal viability, not applicable or unspecified: Secondary | ICD-10-CM

## 2013-10-27 LAB — US OB COMP + 14 WK

## 2013-10-27 LAB — POCT URINALYSIS DIP (DEVICE)
Bilirubin Urine: NEGATIVE
Glucose, UA: NEGATIVE mg/dL
HGB URINE DIPSTICK: NEGATIVE
Ketones, ur: NEGATIVE mg/dL
NITRITE: NEGATIVE
PH: 7 (ref 5.0–8.0)
Protein, ur: NEGATIVE mg/dL
Specific Gravity, Urine: 1.01 (ref 1.005–1.030)
UROBILINOGEN UA: 0.2 mg/dL (ref 0.0–1.0)

## 2013-10-27 NOTE — Patient Instructions (Signed)
Second Trimester of Pregnancy The second trimester is from week 13 through week 28, months 4 through 6. The second trimester is often a time when you feel your best. Your body has also adjusted to being pregnant, and you begin to feel better physically. Usually, morning sickness has lessened or quit completely, you may have more energy, and you may have an increase in appetite. The second trimester is also a time when the fetus is growing rapidly. At the end of the sixth month, the fetus is about 9 inches long and weighs about 1 pounds. You will likely begin to feel the baby move (quickening) between 18 and 20 weeks of the pregnancy. BODY CHANGES Your body goes through many changes during pregnancy. The changes vary from woman to woman.   Your weight will continue to increase. You will notice your lower abdomen bulging out.  You may begin to get stretch marks on your hips, abdomen, and breasts.  You may develop headaches that can be relieved by medicines approved by your health care provider.  You may urinate more often because the fetus is pressing on your bladder.  You may develop or continue to have heartburn as a result of your pregnancy.  You may develop constipation because certain hormones are causing the muscles that push waste through your intestines to slow down.  You may develop hemorrhoids or swollen, bulging veins (varicose veins).  You may have back pain because of the weight gain and pregnancy hormones relaxing your joints between the bones in your pelvis and as a result of a shift in weight and the muscles that support your balance.  Your breasts will continue to grow and be tender.  Your gums may bleed and may be sensitive to brushing and flossing.  Dark spots or blotches (chloasma, mask of pregnancy) may develop on your face. This will likely fade after the baby is born.  A dark line from your belly button to the pubic area (linea nigra) may appear. This will likely fade  after the baby is born.  You may have changes in your hair. These can include thickening of your hair, rapid growth, and changes in texture. Some women also have hair loss during or after pregnancy, or hair that feels dry or thin. Your hair will most likely return to normal after your baby is born. WHAT TO EXPECT AT YOUR PRENATAL VISITS During a routine prenatal visit:  You will be weighed to make sure you and the fetus are growing normally.  Your blood pressure will be taken.  Your abdomen will be measured to track your baby's growth.  The fetal heartbeat will be listened to.  Any test results from the previous visit will be discussed. Your health care provider may ask you:  How you are feeling.  If you are feeling the baby move.  If you have had any abnormal symptoms, such as leaking fluid, bleeding, severe headaches, or abdominal cramping.  If you have any questions. Other tests that may be performed during your second trimester include:  Blood tests that check for:  Low iron levels (anemia).  Gestational diabetes (between 24 and 28 weeks).  Rh antibodies.  Urine tests to check for infections, diabetes, or protein in the urine.  An ultrasound to confirm the proper growth and development of the baby.  An amniocentesis to check for possible genetic problems.  Fetal screens for spina bifida and Down syndrome. HOME CARE INSTRUCTIONS   Avoid all smoking, herbs, alcohol, and unprescribed   drugs. These chemicals affect the formation and growth of the baby.  Follow your health care provider's instructions regarding medicine use. There are medicines that are either safe or unsafe to take during pregnancy.  Exercise only as directed by your health care provider. Experiencing uterine cramps is a good sign to stop exercising.  Continue to eat regular, healthy meals.  Wear a good support bra for breast tenderness.  Do not use hot tubs, steam rooms, or saunas.  Wear your  seat belt at all times when driving.  Avoid raw meat, uncooked cheese, cat litter boxes, and soil used by cats. These carry germs that can cause birth defects in the baby.  Take your prenatal vitamins.  Try taking a stool softener (if your health care provider approves) if you develop constipation. Eat more high-fiber foods, such as fresh vegetables or fruit and whole grains. Drink plenty of fluids to keep your urine clear or pale yellow.  Take warm sitz baths to soothe any pain or discomfort caused by hemorrhoids. Use hemorrhoid cream if your health care provider approves.  If you develop varicose veins, wear support hose. Elevate your feet for 15 minutes, 3-4 times a day. Limit salt in your diet.  Avoid heavy lifting, wear low heel shoes, and practice good posture.  Rest with your legs elevated if you have leg cramps or low back pain.  Visit your dentist if you have not gone yet during your pregnancy. Use a soft toothbrush to brush your teeth and be gentle when you floss.  A sexual relationship may be continued unless your health care provider directs you otherwise.  Continue to go to all your prenatal visits as directed by your health care provider. SEEK MEDICAL CARE IF:   You have dizziness.  You have mild pelvic cramps, pelvic pressure, or nagging pain in the abdominal area.  You have persistent nausea, vomiting, or diarrhea.  You have a bad smelling vaginal discharge.  You have pain with urination. SEEK IMMEDIATE MEDICAL CARE IF:   You have a fever.  You are leaking fluid from your vagina.  You have spotting or bleeding from your vagina.  You have severe abdominal cramping or pain.  You have rapid weight gain or loss.  You have shortness of breath with chest pain.  You notice sudden or extreme swelling of your face, hands, ankles, feet, or legs.  You have not felt your baby move in over an hour.  You have severe headaches that do not go away with  medicine.  You have vision changes. Document Released: 03/04/2001 Document Revised: 03/15/2013 Document Reviewed: 05/11/2012 Exit[employer] Patient Information 2015 [employer]. This information is not intended to replace advice given to you by your health care provider. Make sure you discuss any questions you have with your health care provider.  

## 2013-10-27 NOTE — Progress Notes (Signed)
C/o a little pain when lies down sometimes. C/ o sob sometimes when she bends over.

## 2013-10-27 NOTE — Progress Notes (Signed)
[facility] A - vtx, FHR = 156 bpm per PW doppler, Twin B - transverse, FHR = 134 bpm per PW doppler

## 2013-10-27 NOTE — Progress Notes (Signed)
sono to check FHR Has f/u anatomy and growth scan F/u 4 weeks or sooner prn Pt c/o SOB- pregnancy related No ctx

## 2013-11-02 ENCOUNTER — Ambulatory Visit (HOSPITAL_COMMUNITY): Payer: Medicaid Other

## 2013-11-03 ENCOUNTER — Other Ambulatory Visit: Payer: Self-pay | Admitting: Obstetrics & Gynecology

## 2013-11-03 ENCOUNTER — Other Ambulatory Visit: Payer: Self-pay | Admitting: Family Medicine

## 2013-11-03 ENCOUNTER — Ambulatory Visit (HOSPITAL_COMMUNITY)
Admission: RE | Admit: 2013-11-03 | Discharge: 2013-11-03 | Disposition: A | Discharge status: Home / Self Care | Payer: Medicaid Other | Source: Ambulatory Visit | Attending: Family Medicine | Admitting: Family Medicine

## 2013-11-03 VITALS — BP 104/60 | HR 90 | Wt 119.0 lb

## 2013-11-03 DIAGNOSIS — IMO0002 Reserved for concepts with insufficient information to code with codable children: Secondary | ICD-10-CM

## 2013-11-03 DIAGNOSIS — IMO0001 Reserved for inherently not codable concepts without codable children: Secondary | ICD-10-CM | POA: Insufficient documentation

## 2013-11-03 DIAGNOSIS — O30009 Twin pregnancy, unspecified number of placenta and unspecified number of amniotic sacs, unspecified trimester: Secondary | ICD-10-CM | POA: Diagnosis present

## 2013-11-03 DIAGNOSIS — O365920 Maternal care for other known or suspected poor fetal growth, second trimester, not applicable or unspecified: Secondary | ICD-10-CM | POA: Insufficient documentation

## 2013-11-03 DIAGNOSIS — O09513 Supervision of elderly primigravida, third trimester: Secondary | ICD-10-CM

## 2013-11-03 DIAGNOSIS — O365922 Maternal care for other known or suspected poor fetal growth, second trimester, fetus 2: Secondary | ICD-10-CM

## 2013-11-03 DIAGNOSIS — O30031 Twin pregnancy, monochorionic/diamniotic, first trimester: Secondary | ICD-10-CM

## 2013-11-03 DIAGNOSIS — O09519 Supervision of elderly primigravida, unspecified trimester: Secondary | ICD-10-CM | POA: Insufficient documentation

## 2013-11-09 ENCOUNTER — Ambulatory Visit (HOSPITAL_COMMUNITY)
Admission: RE | Admit: 2013-11-09 | Discharge: 2013-11-09 | Disposition: A | Discharge status: Home / Self Care | Payer: Medicaid Other | Source: Ambulatory Visit | Attending: Obstetrics & Gynecology | Admitting: Obstetrics & Gynecology

## 2013-11-09 ENCOUNTER — Encounter (HOSPITAL_COMMUNITY): Payer: Self-pay

## 2013-11-09 VITALS — BP 111/66 | HR 84 | Wt 120.0 lb

## 2013-11-09 DIAGNOSIS — O30009 Twin pregnancy, unspecified number of placenta and unspecified number of amniotic sacs, unspecified trimester: Secondary | ICD-10-CM | POA: Diagnosis not present

## 2013-11-09 DIAGNOSIS — O365922 Maternal care for other known or suspected poor fetal growth, second trimester, fetus 2: Secondary | ICD-10-CM

## 2013-11-09 DIAGNOSIS — O30031 Twin pregnancy, monochorionic/diamniotic, first trimester: Secondary | ICD-10-CM

## 2013-11-09 DIAGNOSIS — O09513 Supervision of elderly primigravida, third trimester: Secondary | ICD-10-CM

## 2013-11-09 DIAGNOSIS — IMO0001 Reserved for inherently not codable concepts without codable children: Secondary | ICD-10-CM | POA: Diagnosis not present

## 2013-11-09 DIAGNOSIS — O09519 Supervision of elderly primigravida, unspecified trimester: Secondary | ICD-10-CM | POA: Insufficient documentation

## 2013-11-16 ENCOUNTER — Encounter: Payer: Self-pay | Admitting: Family Medicine

## 2013-11-16 ENCOUNTER — Ambulatory Visit (HOSPITAL_COMMUNITY)
Admission: RE | Admit: 2013-11-16 | Discharge: 2013-11-16 | Disposition: A | Discharge status: Home / Self Care | Payer: Medicaid Other | Source: Ambulatory Visit | Attending: Family Medicine | Admitting: Family Medicine

## 2013-11-16 VITALS — BP 103/59 | HR 74 | Wt 120.5 lb

## 2013-11-16 DIAGNOSIS — O09519 Supervision of elderly primigravida, unspecified trimester: Secondary | ICD-10-CM | POA: Insufficient documentation

## 2013-11-16 DIAGNOSIS — O09512 Supervision of elderly primigravida, second trimester: Secondary | ICD-10-CM

## 2013-11-16 DIAGNOSIS — Z3689 Encounter for other specified antenatal screening: Secondary | ICD-10-CM | POA: Insufficient documentation

## 2013-11-16 DIAGNOSIS — O30009 Twin pregnancy, unspecified number of placenta and unspecified number of amniotic sacs, unspecified trimester: Secondary | ICD-10-CM | POA: Diagnosis not present

## 2013-11-16 DIAGNOSIS — O365922 Maternal care for other known or suspected poor fetal growth, second trimester, fetus 2: Secondary | ICD-10-CM

## 2013-11-16 DIAGNOSIS — O30032 Twin pregnancy, monochorionic/diamniotic, second trimester: Secondary | ICD-10-CM

## 2013-11-16 DIAGNOSIS — O09513 Supervision of elderly primigravida, third trimester: Secondary | ICD-10-CM

## 2013-11-17 ENCOUNTER — Encounter: Payer: Self-pay | Admitting: Obstetrics & Gynecology

## 2013-11-18 ENCOUNTER — Other Ambulatory Visit: Payer: Self-pay | Admitting: Internal Medicine

## 2013-11-18 MED ORDER — VITAMIN D (ERGOCALCIFEROL) 1.25 MG (50000 UNIT) PO CAPS: 50000.0000 [IU] | ORAL_CAPSULE | ORAL | Status: DC

## 2013-11-18 NOTE — Telephone Encounter (Signed)
Pt's husband call stating pt is out of medication Vitamin D, Ergocalciferol, (DRISDOL). Pt's husband informed of wife's upcoming appt, Monday 11/21/13. Also informed pt's husband that the doctor will be discussing meds at this appt. Pt's husband insist. Pls contact pt.

## 2013-11-18 NOTE — Telephone Encounter (Signed)
Patient husband calling stating his wife is very weak and needs a refill on her Vitamin D. PATIENT IS PREGNANT.  Informed patient's husband that he should call his wife's OBGYN to see if its ok. He states you seen her since she has been pregnant.

## 2013-11-21 ENCOUNTER — Ambulatory Visit: Payer: Medicaid Other | Attending: Internal Medicine | Admitting: Internal Medicine

## 2013-11-21 ENCOUNTER — Encounter: Payer: Self-pay | Admitting: Internal Medicine

## 2013-11-21 VITALS — BP 115/71 | HR 102 | Temp 98.0°F | Resp 16 | Wt 122.0 lb

## 2013-11-21 DIAGNOSIS — O99019 Anemia complicating pregnancy, unspecified trimester: Secondary | ICD-10-CM | POA: Insufficient documentation

## 2013-11-21 DIAGNOSIS — Z09 Encounter for follow-up examination after completed treatment for conditions other than malignant neoplasm: Secondary | ICD-10-CM

## 2013-11-21 DIAGNOSIS — E559 Vitamin D deficiency, unspecified: Secondary | ICD-10-CM

## 2013-11-21 DIAGNOSIS — D649 Anemia, unspecified: Secondary | ICD-10-CM

## 2013-11-21 DIAGNOSIS — O99891 Other specified diseases and conditions complicating pregnancy: Secondary | ICD-10-CM | POA: Insufficient documentation

## 2013-11-21 DIAGNOSIS — O9989 Other specified diseases and conditions complicating pregnancy, childbirth and the puerperium: Secondary | ICD-10-CM

## 2013-11-21 DIAGNOSIS — O30009 Twin pregnancy, unspecified number of placenta and unspecified number of amniotic sacs, unspecified trimester: Secondary | ICD-10-CM | POA: Diagnosis not present

## 2013-11-21 MED ORDER — FERROUS SULFATE 325 (65 FE) MG PO TABS
325.0000 mg | ORAL_TABLET | Freq: Three times a day (TID) | ORAL | Status: DC
Start: 2013-11-21 — End: 2014-01-16

## 2013-11-21 NOTE — Progress Notes (Signed)
Patient here with interpreter Here for a follow up Currently pregnant with twins due to arrive in Dec

## 2013-11-21 NOTE — Patient Instructions (Signed)

## 2013-11-21 NOTE — Progress Notes (Signed)
MRN: [id number] Name: Jill [name]  Sex: female Age: 43 y.o. DOB: [birth year]  Allergies: Review of patient's allergies indicates no known allergies.  Chief Complaint  Patient presents with  . Follow-up    HPI: Patient is 43 y.o. female who is pregnant with  following up with her OB/GYN comes today for followup she had blood work done which was reviewed with the patient noticed anemia as well as has vitamin D deficiency, prescription has already been sent to the pharmacy, she denies any acute symptoms occasionally get short of breath and tired which can be secondary to pregnancy and anemia.  Past Medical History  Diagnosis Date  . Medical history non-contributory   . Polyp of cervix     Past Surgical History  Procedure Laterality Date  . No past surgeries        Medication List       This list is accurate as of: 11/21/13  3:06 PM.  Always use your most recent med list.               acetaminophen 325 MG tablet  Commonly known as:  TYLENOL  Take 2 tablets (650 mg total) by mouth once.     doxylamine (Sleep) 25 MG tablet  Commonly known as:  UNISOM  Take 25 mg by mouth at bedtime.     ferrous sulfate 325 (65 FE) MG tablet  Commonly known as:  FERROUSUL  Take 1 tablet (325 mg total) by mouth 3 (three) times daily with meals.     folic acid 1 MG tablet  Commonly known as:  FOLVITE  Take 1 tablet (1 mg total) by mouth daily.     Prenatal Vitamins 28-0.8 MG Tabs  Take 1 tablet by mouth daily.     promethazine 25 MG tablet  Commonly known as:  PHENERGAN  Take 0.5 tablets (12.5 mg total) by mouth every 6 (six) hours as needed for nausea or vomiting.     pyridOXINE 100 MG tablet  Commonly known as:  VITAMIN B-6  Take 100 mg by mouth daily. States she takes half a pill in morning and other half at night.     Vitamin D (Ergocalciferol) 50000 UNITS Caps capsule  Commonly known as:  DRISDOL  Take 1 capsule (50,000 Units total) by mouth every 7 (seven) days.          Meds ordered this encounter  Medications  . ferrous sulfate (FERROUSUL) 325 (65 FE) MG tablet    Sig: Take 1 tablet (325 mg total) by mouth 3 (three) times daily with meals.    Dispense:  90 tablet    Refill:  3     There is no immunization history on file for this patient.  Family History  Problem Relation Age of Onset  . Diabetes Mother   . Heart disease Mother     History  Substance Use Topics  . Smoking status: Never Smoker   . Smokeless tobacco: Never Used  . Alcohol Use: No    Review of Systems   As noted in HPI  Filed Vitals:   11/21/13 1448  BP: 115/71  Pulse: 102  Temp: 98 F (36.7 C)  Resp: 16    Physical Exam  Physical Exam  Constitutional: No distress.  Eyes: EOM are normal. Pupils are equal, round, and reactive to light.  Cardiovascular: Normal rate and regular rhythm.   Pulmonary/Chest: Breath sounds normal. No respiratory distress. She has no wheezes. She has no rales.  Musculoskeletal: She exhibits no edema.    CBC    Component Value Date/Time   WBC 13.1* 10/12/2013 1623   RBC 3.25* 10/12/2013 1623   HGB 10.5* 10/12/2013 1623   HCT 30.3* 10/12/2013 1623   PLT 182 10/12/2013 1623   MCV 93.2 10/12/2013 1623   LYMPHSABS 1.4 10/12/2013 1623   MONOABS 0.4 10/12/2013 1623   EOSABS 0.0 10/12/2013 1623   BASOSABS 0.0 10/12/2013 1623    CMP     Component Value Date/Time   NA 137 10/12/2013 1623   K 3.6* 10/12/2013 1623   CL 103 10/12/2013 1623   CO2 23 10/12/2013 1623   GLUCOSE 102* 10/12/2013 1623   BUN 6 10/12/2013 1623   CREATININE 0.50 10/12/2013 1623   CREATININE 0.44* 08/23/2013 1636   CALCIUM 8.6 10/12/2013 1623   PROT 5.7* 10/12/2013 1623   ALBUMIN 2.5* 10/12/2013 1623   AST 15 10/12/2013 1623   ALT 9 10/12/2013 1623   ALKPHOS 64 10/12/2013 1623   BILITOT <0.2* 10/12/2013 1623   GFRNONAA >90 10/12/2013 1623   GFRNONAA >89 08/23/2013 1636   GFRAA >90 10/12/2013 1623   GFRAA >89 08/23/2013 1636    No results found for this basename:  chol, tri, ldl    No components found with this basename: hga1c    Lab Results  Component Value Date/Time   AST 15 10/12/2013  4:23 PM    Assessment and Plan  Follow up  Anemia, unspecified - Plan: Started patient on ferrous sulfate (FERROUSUL) 325 (65 FE) MG tablet to take 3 times a day, also advise for high fiber diet to prevent constipation.  Unspecified vitamin D deficiency Patient will take vitamin D 50,000 units once a week dose for 3 months and then start taking over-the-counter vitamin D 2000 units daily.   Return in about 6 months (around 05/22/2014).  Doris Cheadle, MD

## 2013-11-23 ENCOUNTER — Encounter (HOSPITAL_COMMUNITY): Payer: Self-pay

## 2013-11-23 ENCOUNTER — Ambulatory Visit (HOSPITAL_COMMUNITY)
Admission: RE | Admit: 2013-11-23 | Discharge: 2013-11-23 | Disposition: A | Discharge status: Home / Self Care | Payer: Medicaid Other | Source: Ambulatory Visit | Attending: Family Medicine | Admitting: Family Medicine

## 2013-11-23 VITALS — BP 106/57 | HR 89 | Wt 122.0 lb

## 2013-11-23 DIAGNOSIS — O30031 Twin pregnancy, monochorionic/diamniotic, first trimester: Secondary | ICD-10-CM

## 2013-11-23 DIAGNOSIS — O09529 Supervision of elderly multigravida, unspecified trimester: Secondary | ICD-10-CM | POA: Diagnosis not present

## 2013-11-23 DIAGNOSIS — O30009 Twin pregnancy, unspecified number of placenta and unspecified number of amniotic sacs, unspecified trimester: Secondary | ICD-10-CM | POA: Diagnosis not present

## 2013-11-23 DIAGNOSIS — O365922 Maternal care for other known or suspected poor fetal growth, second trimester, fetus 2: Secondary | ICD-10-CM

## 2013-11-23 DIAGNOSIS — O365990 Maternal care for other known or suspected poor fetal growth, unspecified trimester, not applicable or unspecified: Secondary | ICD-10-CM | POA: Insufficient documentation

## 2013-11-23 DIAGNOSIS — O30039 Twin pregnancy, monochorionic/diamniotic, unspecified trimester: Secondary | ICD-10-CM | POA: Insufficient documentation

## 2013-11-23 DIAGNOSIS — O09513 Supervision of elderly primigravida, third trimester: Secondary | ICD-10-CM

## 2013-11-23 DIAGNOSIS — IMO0002 Reserved for concepts with insufficient information to code with codable children: Secondary | ICD-10-CM

## 2013-11-24 ENCOUNTER — Ambulatory Visit (INDEPENDENT_AMBULATORY_CARE_PROVIDER_SITE_OTHER): Payer: Medicaid Other | Admitting: Family Medicine

## 2013-11-24 VITALS — BP 100/66 | HR 109 | Wt 122.1 lb

## 2013-11-24 DIAGNOSIS — O30039 Twin pregnancy, monochorionic/diamniotic, unspecified trimester: Secondary | ICD-10-CM

## 2013-11-24 DIAGNOSIS — Z23 Encounter for immunization: Secondary | ICD-10-CM

## 2013-11-24 DIAGNOSIS — O09529 Supervision of elderly multigravida, unspecified trimester: Secondary | ICD-10-CM

## 2013-11-24 DIAGNOSIS — IMO0002 Reserved for concepts with insufficient information to code with codable children: Secondary | ICD-10-CM

## 2013-11-24 DIAGNOSIS — O30009 Twin pregnancy, unspecified number of placenta and unspecified number of amniotic sacs, unspecified trimester: Secondary | ICD-10-CM

## 2013-11-24 LAB — POCT URINALYSIS DIP (DEVICE)
Bilirubin Urine: NEGATIVE
Glucose, UA: NEGATIVE mg/dL
KETONES UR: NEGATIVE mg/dL
Nitrite: NEGATIVE
PH: 7 (ref 5.0–8.0)
Protein, ur: NEGATIVE mg/dL
Specific Gravity, Urine: 1.015 (ref 1.005–1.030)
Urobilinogen, UA: 0.2 mg/dL (ref 0.0–1.0)

## 2013-11-24 LAB — CBC
HEMATOCRIT: 32 % — AB (ref 36.0–46.0)
Hemoglobin: 11.3 g/dL — ABNORMAL LOW (ref 12.0–15.0)
MCH: 32.6 pg (ref 26.0–34.0)
MCHC: 35.3 g/dL (ref 30.0–36.0)
MCV: 92.2 fL (ref 78.0–100.0)
Platelets: 178 10*3/uL (ref 150–400)
RBC: 3.47 MIL/uL — ABNORMAL LOW (ref 3.87–5.11)
RDW: 14.9 % (ref 11.5–15.5)
WBC: 9.7 10*3/uL (ref 4.0–10.5)

## 2013-11-24 MED ORDER — TETANUS-DIPHTH-ACELL PERTUSSIS 5-2.5-18.5 LF-MCG/0.5 IM SUSP
0.5000 mL | Freq: Once | INTRAMUSCULAR | Status: AC
  Administered 2013-11-24: 0.5 mL via INTRAMUSCULAR

## 2013-11-24 NOTE — Progress Notes (Signed)
Patient reports pelvic pressure and occasional contraction that starts on one side and goes to the other

## 2013-11-24 NOTE — Progress Notes (Signed)
Patient is 43 y.o. G1P0 [redacted]w[redacted]d.  +FM, denies LOF, VB, vaginal discharge.  Overall feeling well.  Has some occasional contractions when she lays down or sleep, up to twice an hour. - weekly AFI, BPP to start next week

## 2013-11-24 NOTE — Progress Notes (Signed)
Growth U/S 12/15/13 @ 330p.  Pt aware of 11/30/13 u/s appt.  Translation through private interpreter.

## 2013-11-25 LAB — RPR

## 2013-11-25 LAB — GLUCOSE TOLERANCE, 1 HOUR (50G) W/O FASTING: Glucose, 1 Hour GTT: 149 mg/dL — ABNORMAL HIGH (ref 70–140)

## 2013-11-25 LAB — HIV ANTIBODY (ROUTINE TESTING W REFLEX): HIV 1&2 Ab, 4th Generation: NONREACTIVE

## 2013-11-29 ENCOUNTER — Other Ambulatory Visit: Payer: Self-pay | Admitting: Family Medicine

## 2013-11-29 DIAGNOSIS — IMO0002 Reserved for concepts with insufficient information to code with codable children: Secondary | ICD-10-CM

## 2013-11-29 DIAGNOSIS — O30039 Twin pregnancy, monochorionic/diamniotic, unspecified trimester: Secondary | ICD-10-CM

## 2013-11-30 ENCOUNTER — Ambulatory Visit (HOSPITAL_COMMUNITY)
Admission: RE | Admit: 2013-11-30 | Discharge: 2013-11-30 | Disposition: A | Discharge status: Home / Self Care | Payer: Medicaid Other | Source: Ambulatory Visit | Attending: Family Medicine | Admitting: Family Medicine

## 2013-11-30 ENCOUNTER — Encounter: Payer: Self-pay | Admitting: Obstetrics & Gynecology

## 2013-11-30 ENCOUNTER — Encounter (HOSPITAL_COMMUNITY): Payer: Self-pay

## 2013-11-30 VITALS — BP 96/61 | HR 86 | Wt 124.0 lb

## 2013-11-30 DIAGNOSIS — O09529 Supervision of elderly multigravida, unspecified trimester: Secondary | ICD-10-CM | POA: Diagnosis not present

## 2013-11-30 DIAGNOSIS — O30009 Twin pregnancy, unspecified number of placenta and unspecified number of amniotic sacs, unspecified trimester: Secondary | ICD-10-CM | POA: Insufficient documentation

## 2013-11-30 DIAGNOSIS — O365990 Maternal care for other known or suspected poor fetal growth, unspecified trimester, not applicable or unspecified: Secondary | ICD-10-CM | POA: Diagnosis not present

## 2013-11-30 DIAGNOSIS — O365921 Maternal care for other known or suspected poor fetal growth, second trimester, fetus 1: Secondary | ICD-10-CM

## 2013-11-30 DIAGNOSIS — IMO0002 Reserved for concepts with insufficient information to code with codable children: Secondary | ICD-10-CM

## 2013-11-30 DIAGNOSIS — O365922 Maternal care for other known or suspected poor fetal growth, second trimester, fetus 2: Secondary | ICD-10-CM

## 2013-11-30 DIAGNOSIS — O30039 Twin pregnancy, monochorionic/diamniotic, unspecified trimester: Secondary | ICD-10-CM | POA: Diagnosis not present

## 2013-12-07 ENCOUNTER — Encounter (HOSPITAL_COMMUNITY): Payer: Self-pay

## 2013-12-07 ENCOUNTER — Ambulatory Visit (HOSPITAL_COMMUNITY)
Admission: RE | Admit: 2013-12-07 | Discharge: 2013-12-07 | Disposition: A | Discharge status: Home / Self Care | Payer: Medicaid Other | Source: Ambulatory Visit | Attending: Obstetrics & Gynecology | Admitting: Obstetrics & Gynecology

## 2013-12-07 ENCOUNTER — Other Ambulatory Visit (HOSPITAL_COMMUNITY): Payer: Self-pay | Admitting: Maternal and Fetal Medicine

## 2013-12-07 VITALS — BP 96/60 | HR 90 | Wt 125.5 lb

## 2013-12-07 DIAGNOSIS — O365921 Maternal care for other known or suspected poor fetal growth, second trimester, fetus 1: Secondary | ICD-10-CM

## 2013-12-07 DIAGNOSIS — O30039 Twin pregnancy, monochorionic/diamniotic, unspecified trimester: Secondary | ICD-10-CM

## 2013-12-07 DIAGNOSIS — O365922 Maternal care for other known or suspected poor fetal growth, second trimester, fetus 2: Secondary | ICD-10-CM

## 2013-12-07 DIAGNOSIS — O30009 Twin pregnancy, unspecified number of placenta and unspecified number of amniotic sacs, unspecified trimester: Secondary | ICD-10-CM | POA: Insufficient documentation

## 2013-12-07 DIAGNOSIS — O09513 Supervision of elderly primigravida, third trimester: Secondary | ICD-10-CM

## 2013-12-07 DIAGNOSIS — O09529 Supervision of elderly multigravida, unspecified trimester: Secondary | ICD-10-CM | POA: Insufficient documentation

## 2013-12-07 DIAGNOSIS — O09523 Supervision of elderly multigravida, third trimester: Secondary | ICD-10-CM

## 2013-12-07 DIAGNOSIS — O365990 Maternal care for other known or suspected poor fetal growth, unspecified trimester, not applicable or unspecified: Secondary | ICD-10-CM | POA: Insufficient documentation

## 2013-12-15 ENCOUNTER — Other Ambulatory Visit: Payer: Self-pay | Admitting: Obstetrics & Gynecology

## 2013-12-15 ENCOUNTER — Encounter (HOSPITAL_COMMUNITY): Payer: Self-pay

## 2013-12-15 ENCOUNTER — Ambulatory Visit (HOSPITAL_COMMUNITY): Payer: Medicaid Other

## 2013-12-15 ENCOUNTER — Ambulatory Visit (HOSPITAL_COMMUNITY)
Admission: RE | Admit: 2013-12-15 | Discharge: 2013-12-15 | Disposition: A | Discharge status: Home / Self Care | Payer: Medicaid Other | Source: Ambulatory Visit | Attending: Family Medicine | Admitting: Family Medicine

## 2013-12-15 VITALS — BP 102/64 | HR 93 | Wt 127.0 lb

## 2013-12-15 DIAGNOSIS — O09519 Supervision of elderly primigravida, unspecified trimester: Secondary | ICD-10-CM | POA: Diagnosis not present

## 2013-12-15 DIAGNOSIS — O30039 Twin pregnancy, monochorionic/diamniotic, unspecified trimester: Secondary | ICD-10-CM

## 2013-12-15 DIAGNOSIS — O30009 Twin pregnancy, unspecified number of placenta and unspecified number of amniotic sacs, unspecified trimester: Secondary | ICD-10-CM | POA: Diagnosis not present

## 2013-12-15 DIAGNOSIS — IMO0001 Reserved for inherently not codable concepts without codable children: Secondary | ICD-10-CM | POA: Diagnosis not present

## 2013-12-15 DIAGNOSIS — O365932 Maternal care for other known or suspected poor fetal growth, third trimester, fetus 2: Secondary | ICD-10-CM

## 2013-12-15 DIAGNOSIS — O09513 Supervision of elderly primigravida, third trimester: Secondary | ICD-10-CM

## 2013-12-15 DIAGNOSIS — O365921 Maternal care for other known or suspected poor fetal growth, second trimester, fetus 1: Secondary | ICD-10-CM

## 2013-12-15 DIAGNOSIS — O365990 Maternal care for other known or suspected poor fetal growth, unspecified trimester, not applicable or unspecified: Secondary | ICD-10-CM | POA: Diagnosis not present

## 2013-12-16 ENCOUNTER — Encounter: Payer: Self-pay | Admitting: Obstetrics & Gynecology

## 2013-12-21 ENCOUNTER — Other Ambulatory Visit: Payer: Self-pay | Admitting: Internal Medicine

## 2013-12-22 ENCOUNTER — Ambulatory Visit (HOSPITAL_COMMUNITY)
Admission: RE | Admit: 2013-12-22 | Discharge: 2013-12-22 | Disposition: A | Discharge status: Home / Self Care | Payer: Medicaid Other | Source: Ambulatory Visit | Attending: Obstetrics & Gynecology | Admitting: Obstetrics & Gynecology

## 2013-12-22 ENCOUNTER — Ambulatory Visit (INDEPENDENT_AMBULATORY_CARE_PROVIDER_SITE_OTHER): Payer: Medicaid Other | Admitting: Obstetrics & Gynecology

## 2013-12-22 VITALS — BP 97/59 | HR 76 | Wt 128.0 lb

## 2013-12-22 VITALS — BP 104/67 | HR 89 | Temp 97.4°F | Wt 127.5 lb

## 2013-12-22 DIAGNOSIS — Z3A31 31 weeks gestation of pregnancy: Secondary | ICD-10-CM

## 2013-12-22 DIAGNOSIS — O09513 Supervision of elderly primigravida, third trimester: Secondary | ICD-10-CM | POA: Diagnosis not present

## 2013-12-22 DIAGNOSIS — O30003 Twin pregnancy, unspecified number of placenta and unspecified number of amniotic sacs, third trimester: Secondary | ICD-10-CM | POA: Insufficient documentation

## 2013-12-22 DIAGNOSIS — Z23 Encounter for immunization: Secondary | ICD-10-CM

## 2013-12-22 DIAGNOSIS — O30033 Twin pregnancy, monochorionic/diamniotic, third trimester: Secondary | ICD-10-CM

## 2013-12-22 DIAGNOSIS — O30009 Twin pregnancy, unspecified number of placenta and unspecified number of amniotic sacs, unspecified trimester: Secondary | ICD-10-CM

## 2013-12-22 DIAGNOSIS — O365932 Maternal care for other known or suspected poor fetal growth, third trimester, fetus 2: Secondary | ICD-10-CM

## 2013-12-22 DIAGNOSIS — Z3480 Encounter for supervision of other normal pregnancy, unspecified trimester: Secondary | ICD-10-CM

## 2013-12-22 LAB — POCT URINALYSIS DIP (DEVICE)
Bilirubin Urine: NEGATIVE
GLUCOSE, UA: NEGATIVE mg/dL
Ketones, ur: NEGATIVE mg/dL
NITRITE: NEGATIVE
PROTEIN: NEGATIVE mg/dL
Specific Gravity, Urine: 1.01 (ref 1.005–1.030)
UROBILINOGEN UA: 0.2 mg/dL (ref 0.0–1.0)
pH: 7 (ref 5.0–8.0)

## 2013-12-22 MED ORDER — CYCLOBENZAPRINE HCL 10 MG PO TABS: 10.0000 mg | ORAL_TABLET | Freq: Three times a day (TID) | ORAL | Status: DC | PRN

## 2013-12-22 NOTE — Patient Instructions (Signed)
[facility] [address]

## 2013-12-22 NOTE — Progress Notes (Signed)
Pt followed by MFM for mono di twins, velamentous cord insertion, elevat3ed dopplers, lagging AC.  Has next scan today.  Will follow recommendations.   Pt having low back pain which goes into the back of the leg (no contractions).  Pt takes Tylenol occasionally .  Suggest pregnancy belt.  Will also give flexeril

## 2013-12-22 NOTE — Progress Notes (Signed)
Patient reports lower back pain; Farsi interpreter present

## 2013-12-27 ENCOUNTER — Other Ambulatory Visit (HOSPITAL_COMMUNITY): Payer: Self-pay | Admitting: Maternal and Fetal Medicine

## 2013-12-27 DIAGNOSIS — O365932 Maternal care for other known or suspected poor fetal growth, third trimester, fetus 2: Secondary | ICD-10-CM

## 2013-12-27 DIAGNOSIS — O09513 Supervision of elderly primigravida, third trimester: Secondary | ICD-10-CM

## 2013-12-27 DIAGNOSIS — O30033 Twin pregnancy, monochorionic/diamniotic, third trimester: Secondary | ICD-10-CM

## 2013-12-27 DIAGNOSIS — Z3A31 31 weeks gestation of pregnancy: Secondary | ICD-10-CM

## 2013-12-29 ENCOUNTER — Encounter (HOSPITAL_COMMUNITY): Payer: Self-pay

## 2013-12-29 ENCOUNTER — Ambulatory Visit (HOSPITAL_COMMUNITY)
Admission: RE | Admit: 2013-12-29 | Discharge: 2013-12-29 | Disposition: A | Discharge status: Home / Self Care | Payer: Medicaid Other | Source: Ambulatory Visit | Attending: Obstetrics & Gynecology | Admitting: Obstetrics & Gynecology

## 2013-12-29 ENCOUNTER — Ambulatory Visit (INDEPENDENT_AMBULATORY_CARE_PROVIDER_SITE_OTHER): Payer: Medicaid Other | Admitting: Family

## 2013-12-29 VITALS — BP 91/63 | HR 96 | Wt 131.0 lb

## 2013-12-29 VITALS — BP 113/64 | HR 96 | Temp 97.6°F | Wt 129.6 lb

## 2013-12-29 DIAGNOSIS — Z3A00 Weeks of gestation of pregnancy not specified: Secondary | ICD-10-CM | POA: Insufficient documentation

## 2013-12-29 DIAGNOSIS — O365990 Maternal care for other known or suspected poor fetal growth, unspecified trimester, not applicable or unspecified: Secondary | ICD-10-CM | POA: Insufficient documentation

## 2013-12-29 DIAGNOSIS — O30039 Twin pregnancy, monochorionic/diamniotic, unspecified trimester: Secondary | ICD-10-CM | POA: Diagnosis not present

## 2013-12-29 DIAGNOSIS — O09513 Supervision of elderly primigravida, third trimester: Secondary | ICD-10-CM

## 2013-12-29 DIAGNOSIS — O365922 Maternal care for other known or suspected poor fetal growth, second trimester, fetus 2: Secondary | ICD-10-CM

## 2013-12-29 DIAGNOSIS — O30031 Twin pregnancy, monochorionic/diamniotic, first trimester: Secondary | ICD-10-CM

## 2013-12-29 DIAGNOSIS — O365921 Maternal care for other known or suspected poor fetal growth, second trimester, fetus 1: Secondary | ICD-10-CM

## 2013-12-29 DIAGNOSIS — N898 Other specified noninflammatory disorders of vagina: Secondary | ICD-10-CM

## 2013-12-29 DIAGNOSIS — O30009 Twin pregnancy, unspecified number of placenta and unspecified number of amniotic sacs, unspecified trimester: Secondary | ICD-10-CM

## 2013-12-29 DIAGNOSIS — O26893 Other specified pregnancy related conditions, third trimester: Secondary | ICD-10-CM

## 2013-12-29 LAB — POCT URINALYSIS DIP (DEVICE)
BILIRUBIN URINE: NEGATIVE
GLUCOSE, UA: NEGATIVE mg/dL
Hgb urine dipstick: NEGATIVE
Ketones, ur: NEGATIVE mg/dL
NITRITE: NEGATIVE
Protein, ur: NEGATIVE mg/dL
Specific Gravity, Urine: 1.01 (ref 1.005–1.030)
UROBILINOGEN UA: 0.2 mg/dL (ref 0.0–1.0)
pH: 7 (ref 5.0–8.0)

## 2013-12-29 NOTE — Progress Notes (Signed)
Reports pain on side laying on at night; denies contractions, leaking of fluid, or bleeding.  Ultrasound scheduled for today.  +vaginal discharge with slight odor, no itching.  Wet prep sent to lab.

## 2013-12-29 NOTE — Progress Notes (Signed)
Patient reports pelvic pressure and contractions at night; also reports d/c with odor

## 2013-12-30 LAB — WET PREP, GENITAL
CLUE CELLS WET PREP: NONE SEEN
TRICH WET PREP: NONE SEEN
WBC, Wet Prep HPF POC: NONE SEEN
YEAST WET PREP: NONE SEEN

## 2014-01-04 ENCOUNTER — Other Ambulatory Visit (HOSPITAL_COMMUNITY): Payer: Self-pay | Admitting: Maternal and Fetal Medicine

## 2014-01-04 DIAGNOSIS — O365932 Maternal care for other known or suspected poor fetal growth, third trimester, fetus 2: Secondary | ICD-10-CM

## 2014-01-04 DIAGNOSIS — O30033 Twin pregnancy, monochorionic/diamniotic, third trimester: Secondary | ICD-10-CM

## 2014-01-04 DIAGNOSIS — O09513 Supervision of elderly primigravida, third trimester: Secondary | ICD-10-CM

## 2014-01-04 DIAGNOSIS — Z3A31 31 weeks gestation of pregnancy: Secondary | ICD-10-CM

## 2014-01-05 ENCOUNTER — Ambulatory Visit (INDEPENDENT_AMBULATORY_CARE_PROVIDER_SITE_OTHER): Payer: Medicaid Other | Admitting: Obstetrics & Gynecology

## 2014-01-05 ENCOUNTER — Ambulatory Visit (HOSPITAL_COMMUNITY)
Admission: RE | Admit: 2014-01-05 | Payer: Medicaid Other | Source: Ambulatory Visit | Attending: Internal Medicine | Admitting: Internal Medicine

## 2014-01-05 ENCOUNTER — Ambulatory Visit (HOSPITAL_COMMUNITY)
Admission: RE | Admit: 2014-01-05 | Discharge: 2014-01-05 | Disposition: A | Discharge status: Home / Self Care | Payer: Medicaid Other | Source: Ambulatory Visit | Attending: Family Medicine | Admitting: Family Medicine

## 2014-01-05 VITALS — BP 97/62 | HR 83 | Temp 98.3°F | Wt 130.0 lb

## 2014-01-05 VITALS — BP 102/66 | HR 85 | Wt 128.0 lb

## 2014-01-05 DIAGNOSIS — O30031 Twin pregnancy, monochorionic/diamniotic, first trimester: Secondary | ICD-10-CM

## 2014-01-05 DIAGNOSIS — Z3A00 Weeks of gestation of pregnancy not specified: Secondary | ICD-10-CM | POA: Insufficient documentation

## 2014-01-05 DIAGNOSIS — O30042 Twin pregnancy, dichorionic/diamniotic, second trimester: Secondary | ICD-10-CM | POA: Insufficient documentation

## 2014-01-05 DIAGNOSIS — O365922 Maternal care for other known or suspected poor fetal growth, second trimester, fetus 2: Secondary | ICD-10-CM | POA: Insufficient documentation

## 2014-01-05 LAB — POCT URINALYSIS DIP (DEVICE)
Bilirubin Urine: NEGATIVE
Glucose, UA: NEGATIVE mg/dL
Hgb urine dipstick: NEGATIVE
Ketones, ur: NEGATIVE mg/dL
NITRITE: NEGATIVE
PH: 7 (ref 5.0–8.0)
PROTEIN: NEGATIVE mg/dL
Specific Gravity, Urine: <=1.005 (ref 1.005–1.030)
Urobilinogen, UA: 0.2 mg/dL (ref 0.0–1.0)

## 2014-01-05 MED ORDER — BETAMETHASONE SOD PHOS & ACET 6 (3-3) MG/ML IJ SUSP
12.0000 mg | Freq: Every morning | INTRAMUSCULAR | Status: AC
  Administered 2014-01-05 – 2014-01-06 (×2): 12 mg via INTRAMUSCULAR

## 2014-01-05 NOTE — Progress Notes (Signed)
Discordant growth IUGR twin B, recommend BMZ and delivery CS in 1 week at 34 weeks

## 2014-01-05 NOTE — Patient Instructions (Signed)
Cesarean Delivery  Cesarean delivery is the birth of a baby through a cut (incision) in the abdomen and womb (uterus).  LET YOUR HEALTH CARE PROVIDER KNOW ABOUT:  All medicines you are taking, including vitamins, herbs, eye drops, creams, and over-the-counter medicines.  Previous problems you or members of your family have had with the use of anesthetics.  Any blood disorders you have.  Previous surgeries you have had.  Medical conditions you have.  Any allergies you have.  Complicationsinvolving the pregnancy. RISKS AND COMPLICATIONS  Generally, this is a safe procedure. However, as with any procedure, complications can occur. Possible complications include:  Bleeding.  Infection.  Blood clots.  Injury to surrounding organs.  Problems with anesthesia.  Injury to the baby. BEFORE THE PROCEDURE   You may be given an antacid medicine to drink. This will prevent acid contents in your stomach from going into your lungs if you vomit during the surgery.  You may be given an antibiotic medicine to prevent infection. PROCEDURE   Hair may be removed from your pubic area and your lower abdomen. This is to prevent infection in the incision site.  A tube (Foley catheter) will be placed in your bladder to drain your urine from your bladder into a bag. This keeps your bladder empty during surgery.  An IV tube will be placed in your vein.  You may be given medicine to numb the lower half of your body (regional anesthetic). If you were in labor, you may have already had an epidural in place which can be used in both labor and cesarean delivery. You may possibly be given medicine to make you sleep (general anesthetic) though this is not as common.  An incision will be made in your abdomen that extends to your uterus. There are 2 basic kinds of incisions:  The horizontal (transverse) incision. Horizontal incisions are from side to side and are used for most routine cesarean  deliveries.  The vertical incision. The vertical incision is from the top of the abdomen to the bottom and is less commonly used. It is often done for women who have a serious complication (extreme prematurity) or under emergency situations.  The horizontal and vertical incisions may both be used at the same time. However, this is very uncommon.  An incision is then made in your uterus to deliver the baby.  Your baby will then be delivered.  Both incisions are then closed with absorbable stitches. AFTER THE PROCEDURE   If you were awake during the surgery, you will see your baby right away. If you were asleep, you will see your baby as soon as you are awake.  You may breastfeed your baby after surgery.  You may be able to get up and walk the same day as the surgery. If you need to stay in bed for a period of time, you will receive help to turn, cough, and take deep breaths after surgery. This helps prevent lung problems such as pneumonia.  Do not get out of bed alone the first time after surgery. You will need help getting out of bed until you are able to do this by yourself.  You may be able to shower the day after your cesarean delivery. After the bandage (dressing) is taken off the incision site, a nurse will assist you to shower if you would like help.  You will have pneumatic compression hose placed on your lower legs. This is done to prevent blood clots. When you are up   and walking regularly, they will no longer be necessary.  Do not cross your legs when you sit.  Save any blood clots that you pass. If you pass a clot while on the toilet, do not flush it. Call for the nurse. Tell the nurse if you think you are bleeding too much or passing too many clots.  You will be given medicine as needed. Let your health care providers know if you are hurting. You may also be given an antibiotic to prevent an infection.  Your IV tube will be taken out when you are drinking a reasonable  amount of fluids. The Foley catheter is taken out when you are up and walking.  If your blood type is Rh negative and your baby's blood type is Rh positive, you will be given a shot of anti-D immune globulin. This shot prevents you from having Rh problems with a future pregnancy. You should get the shot even if you had your tubes tied (tubal ligation).  If you are allowed to take the baby for a walk, place the baby in the bassinet and push it. Do not carry your baby in your arms. Document Released: 03/10/2005 Document Revised: 12/29/2012 Document Reviewed: 09/29/2012 [employer] Patient Information 2015 [employer]. This information is not intended to replace advice given to you by your health care provider. Make sure you discuss any questions you have with your health care provider.  

## 2014-01-05 NOTE — Progress Notes (Signed)
Reports occasional pain in legs when walking and numbness in right upper thigh and side abdomen.  1hr gtt from 11/24/13 results 149-- patient never had 3hr gtt-- will need to schedule.

## 2014-01-06 ENCOUNTER — Ambulatory Visit (INDEPENDENT_AMBULATORY_CARE_PROVIDER_SITE_OTHER): Payer: Medicaid Other

## 2014-01-06 VITALS — BP 105/62 | HR 94 | Temp 97.9°F | Wt 132.6 lb

## 2014-01-06 DIAGNOSIS — O30031 Twin pregnancy, monochorionic/diamniotic, first trimester: Secondary | ICD-10-CM

## 2014-01-06 MED ORDER — BETAMETHASONE SOD PHOS & ACET 6 (3-3) MG/ML IJ SUSP: 12.0000 mg | Freq: Once | INTRAMUSCULAR | Status: AC

## 2014-01-06 NOTE — Progress Notes (Signed)
Patient here today for second dose of BMZ.

## 2014-01-09 ENCOUNTER — Other Ambulatory Visit: Payer: Medicaid Other

## 2014-01-09 ENCOUNTER — Ambulatory Visit (HOSPITAL_COMMUNITY)
Admission: RE | Admit: 2014-01-09 | Discharge: 2014-01-09 | Disposition: A | Discharge status: Home / Self Care | Payer: Medicaid Other | Source: Ambulatory Visit | Attending: Family Medicine | Admitting: Family Medicine

## 2014-01-09 DIAGNOSIS — Z3A00 Weeks of gestation of pregnancy not specified: Secondary | ICD-10-CM | POA: Diagnosis not present

## 2014-01-09 DIAGNOSIS — O365932 Maternal care for other known or suspected poor fetal growth, third trimester, fetus 2: Secondary | ICD-10-CM

## 2014-01-09 DIAGNOSIS — O30031 Twin pregnancy, monochorionic/diamniotic, first trimester: Secondary | ICD-10-CM

## 2014-01-09 DIAGNOSIS — O365990 Maternal care for other known or suspected poor fetal growth, unspecified trimester, not applicable or unspecified: Secondary | ICD-10-CM | POA: Diagnosis not present

## 2014-01-09 DIAGNOSIS — O30039 Twin pregnancy, monochorionic/diamniotic, unspecified trimester: Secondary | ICD-10-CM | POA: Insufficient documentation

## 2014-01-09 DIAGNOSIS — O365920 Maternal care for other known or suspected poor fetal growth, second trimester, not applicable or unspecified: Secondary | ICD-10-CM

## 2014-01-10 ENCOUNTER — Encounter (HOSPITAL_COMMUNITY)
Admission: RE | Admit: 2014-01-10 | Discharge: 2014-01-10 | Disposition: A | Discharge status: Home / Self Care | Payer: Medicaid Other | Source: Ambulatory Visit | Attending: Obstetrics and Gynecology | Admitting: Obstetrics and Gynecology

## 2014-01-10 ENCOUNTER — Encounter (HOSPITAL_COMMUNITY): Payer: Self-pay

## 2014-01-10 ENCOUNTER — Inpatient Hospital Stay (HOSPITAL_COMMUNITY): Admission: RE | Admit: 2014-01-10 | Payer: Medicaid Other | Source: Ambulatory Visit

## 2014-01-10 VITALS — BP 98/68 | HR 91 | Temp 98.2°F | Resp 16 | Ht 59.0 in | Wt 135.0 lb

## 2014-01-10 DIAGNOSIS — O30031 Twin pregnancy, monochorionic/diamniotic, first trimester: Secondary | ICD-10-CM

## 2014-01-10 DIAGNOSIS — Z603 Acculturation difficulty: Secondary | ICD-10-CM

## 2014-01-10 LAB — CBC
HCT: 33.1 % — ABNORMAL LOW (ref 36.0–46.0)
Hemoglobin: 11.7 g/dL — ABNORMAL LOW (ref 12.0–15.0)
MCH: 34 pg (ref 26.0–34.0)
MCHC: 35.3 g/dL (ref 30.0–36.0)
MCV: 96.2 fL (ref 78.0–100.0)
PLATELETS: 152 10*3/uL (ref 150–400)
RBC: 3.44 MIL/uL — AB (ref 3.87–5.11)
RDW: 13.5 % (ref 11.5–15.5)
WBC: 11.7 10*3/uL — AB (ref 4.0–10.5)

## 2014-01-10 LAB — GLUCOSE TOLERANCE, 3 HOURS
GLUCOSE, 2 HOUR-GESTATIONAL: 112 mg/dL (ref 70–164)
Glucose Tolerance, 1 hour: 160 mg/dL (ref 70–189)
Glucose Tolerance, Fasting: 68 mg/dL — ABNORMAL LOW (ref 70–104)
Glucose, GTT - 3 Hour: 110 mg/dL (ref 70–144)

## 2014-01-10 LAB — TYPE AND SCREEN
ABO/RH(D): B POS
Antibody Screen: NEGATIVE

## 2014-01-10 NOTE — Patient Instructions (Signed)
Your procedure is scheduled on:01/12/14  Enter through the Main Entrance at :0945 am Pick up desk phone and dial [phone] and inform us of your arrival.  Please call [phone] if you have any problems the morning of surgery.  Remember: Do not eat food after midnight: Wednesday water ok until:7am Thursday   You may brush your teeth the morning of surgery.   DO NOT wear jewelry, eye make-up, lipstick,body lotion, or dark fingernail polish.  (Polished toes are ok) You may wear deodorant.  If you are to be admitted after surgery, leave suitcase in car until your room has been assigned. Patients discharged on the day of surgery will not be allowed to drive home. Wear loose fitting, comfortable clothes for your ride home.

## 2014-01-11 LAB — RPR

## 2014-01-12 ENCOUNTER — Inpatient Hospital Stay (HOSPITAL_COMMUNITY): Payer: Medicaid Other | Admitting: Anesthesiology

## 2014-01-12 ENCOUNTER — Encounter (HOSPITAL_COMMUNITY)
Admission: RE | Disposition: A | Discharge status: Home / Self Care | Payer: Self-pay | Source: Ambulatory Visit | Attending: Obstetrics and Gynecology

## 2014-01-12 ENCOUNTER — Encounter (HOSPITAL_COMMUNITY): Payer: Self-pay | Admitting: *Deleted

## 2014-01-12 ENCOUNTER — Inpatient Hospital Stay (HOSPITAL_COMMUNITY)
Admission: RE | Admit: 2014-01-12 | Discharge: 2014-01-16 | DRG: 765 | Disposition: A | Discharge status: Home / Self Care | Payer: Medicaid Other | Source: Ambulatory Visit | Attending: Obstetrics and Gynecology | Admitting: Obstetrics and Gynecology

## 2014-01-12 ENCOUNTER — Encounter (HOSPITAL_COMMUNITY): Payer: Medicaid Other | Admitting: Anesthesiology

## 2014-01-12 DIAGNOSIS — O365932 Maternal care for other known or suspected poor fetal growth, third trimester, fetus 2: Secondary | ICD-10-CM | POA: Diagnosis not present

## 2014-01-12 DIAGNOSIS — Z3A34 34 weeks gestation of pregnancy: Secondary | ICD-10-CM | POA: Diagnosis present

## 2014-01-12 DIAGNOSIS — R338 Other retention of urine: Secondary | ICD-10-CM | POA: Diagnosis not present

## 2014-01-12 DIAGNOSIS — O365931 Maternal care for other known or suspected poor fetal growth, third trimester, fetus 1: Secondary | ICD-10-CM | POA: Diagnosis present

## 2014-01-12 DIAGNOSIS — O321XX1 Maternal care for breech presentation, fetus 1: Secondary | ICD-10-CM

## 2014-01-12 DIAGNOSIS — Z603 Acculturation difficulty: Secondary | ICD-10-CM

## 2014-01-12 DIAGNOSIS — O30031 Twin pregnancy, monochorionic/diamniotic, first trimester: Secondary | ICD-10-CM

## 2014-01-12 DIAGNOSIS — O321XX2 Maternal care for breech presentation, fetus 2: Secondary | ICD-10-CM | POA: Diagnosis present

## 2014-01-12 DIAGNOSIS — O9089 Other complications of the puerperium, not elsewhere classified: Secondary | ICD-10-CM | POA: Diagnosis not present

## 2014-01-12 DIAGNOSIS — O30033 Twin pregnancy, monochorionic/diamniotic, third trimester: Secondary | ICD-10-CM | POA: Diagnosis present

## 2014-01-12 DIAGNOSIS — R339 Retention of urine, unspecified: Secondary | ICD-10-CM | POA: Diagnosis not present

## 2014-01-12 DIAGNOSIS — O09513 Supervision of elderly primigravida, third trimester: Secondary | ICD-10-CM | POA: Diagnosis present

## 2014-01-12 DIAGNOSIS — O09523 Supervision of elderly multigravida, third trimester: Secondary | ICD-10-CM

## 2014-01-12 DIAGNOSIS — Z98891 History of uterine scar from previous surgery: Secondary | ICD-10-CM

## 2014-01-12 SURGERY — Surgical Case
Anesthesia: Spinal | Site: Abdomen

## 2014-01-12 MED ORDER — DEXAMETHASONE SODIUM PHOSPHATE 10 MG/ML IJ SOLN
INTRAMUSCULAR | Status: AC
  Filled 2014-01-12: qty 1

## 2014-01-12 MED ORDER — OXYCODONE-ACETAMINOPHEN 5-325 MG PO TABS
2.0000 | ORAL_TABLET | ORAL | Status: DC | PRN
  Administered 2014-01-13 – 2014-01-15 (×2): 2 via ORAL
  Filled 2014-01-12 (×2): qty 2

## 2014-01-12 MED ORDER — IBUPROFEN 600 MG PO TABS
600.0000 mg | ORAL_TABLET | Freq: Four times a day (QID) | ORAL | Status: DC
  Administered 2014-01-13 – 2014-01-16 (×13): 600 mg via ORAL
  Filled 2014-01-12 (×13): qty 1

## 2014-01-12 MED ORDER — PHENYLEPHRINE 8 MG IN D5W 100 ML (0.08MG/ML) PREMIX OPTIME
INJECTION | INTRAVENOUS | Status: DC | PRN
  Administered 2014-01-12: 60 ug/min via INTRAVENOUS

## 2014-01-12 MED ORDER — LANOLIN HYDROUS EX OINT: 1.0000 "application " | TOPICAL_OINTMENT | CUTANEOUS | Status: DC | PRN

## 2014-01-12 MED ORDER — DIPHENHYDRAMINE HCL 50 MG/ML IJ SOLN: 12.5000 mg | INTRAMUSCULAR | Status: DC | PRN

## 2014-01-12 MED ORDER — NALBUPHINE HCL 10 MG/ML IJ SOLN: 5.0000 mg | Freq: Once | INTRAMUSCULAR | Status: DC | PRN

## 2014-01-12 MED ORDER — OXYTOCIN 10 UNIT/ML IJ SOLN
40.0000 [IU] | INTRAVENOUS | Status: DC | PRN
  Administered 2014-01-12: 40 [IU] via INTRAVENOUS

## 2014-01-12 MED ORDER — LACTATED RINGERS IV SOLN
INTRAVENOUS | Status: DC | PRN
  Administered 2014-01-12 (×2): via INTRAVENOUS

## 2014-01-12 MED ORDER — MORPHINE SULFATE (PF) 0.5 MG/ML IJ SOLN
INTRAMUSCULAR | Status: DC | PRN
  Administered 2014-01-12: .15 mg via INTRATHECAL

## 2014-01-12 MED ORDER — PHENYLEPHRINE 8 MG IN D5W 100 ML (0.08MG/ML) PREMIX OPTIME
INJECTION | INTRAVENOUS | Status: AC
  Filled 2014-01-12: qty 100

## 2014-01-12 MED ORDER — PRENATAL MULTIVITAMIN CH
1.0000 | ORAL_TABLET | Freq: Every day | ORAL | Status: DC
  Administered 2014-01-14 – 2014-01-16 (×3): 1 via ORAL
  Filled 2014-01-12 (×3): qty 1

## 2014-01-12 MED ORDER — DIPHENHYDRAMINE HCL 25 MG PO CAPS: 25.0000 mg | ORAL_CAPSULE | ORAL | Status: DC | PRN

## 2014-01-12 MED ORDER — VITAMIN D (ERGOCALCIFEROL) 1.25 MG (50000 UNIT) PO CAPS: 50000.0000 [IU] | ORAL_CAPSULE | ORAL | Status: DC

## 2014-01-12 MED ORDER — DEXTROSE 5 % IV SOLN
1.0000 ug/kg/h | INTRAVENOUS | Status: DC | PRN
  Filled 2014-01-12: qty 2

## 2014-01-12 MED ORDER — KETOROLAC TROMETHAMINE 30 MG/ML IJ SOLN
INTRAMUSCULAR | Status: AC
  Filled 2014-01-12: qty 1

## 2014-01-12 MED ORDER — NALBUPHINE HCL 10 MG/ML IJ SOLN
5.0000 mg | INTRAMUSCULAR | Status: DC | PRN
Start: 2014-01-12 — End: 2014-01-16

## 2014-01-12 MED ORDER — FENTANYL CITRATE 0.05 MG/ML IJ SOLN
INTRAMUSCULAR | Status: AC
  Filled 2014-01-12: qty 2

## 2014-01-12 MED ORDER — WITCH HAZEL-GLYCERIN EX PADS: 1.0000 "application " | MEDICATED_PAD | CUTANEOUS | Status: DC | PRN

## 2014-01-12 MED ORDER — SCOPOLAMINE 1 MG/3DAYS TD PT72
1.0000 | MEDICATED_PATCH | Freq: Once | TRANSDERMAL | Status: DC
Start: 2014-01-12 — End: 2014-01-15
  Administered 2014-01-12: 1.5 mg via TRANSDERMAL

## 2014-01-12 MED ORDER — DIPHENHYDRAMINE HCL 25 MG PO CAPS: 25.0000 mg | ORAL_CAPSULE | Freq: Four times a day (QID) | ORAL | Status: DC | PRN

## 2014-01-12 MED ORDER — PHENYLEPHRINE HCL 10 MG/ML IJ SOLN
INTRAMUSCULAR | Status: DC | PRN
  Administered 2014-01-12 (×2): 40 ug via INTRAVENOUS

## 2014-01-12 MED ORDER — KETOROLAC TROMETHAMINE 30 MG/ML IJ SOLN
30.0000 mg | Freq: Four times a day (QID) | INTRAMUSCULAR | Status: AC | PRN
  Administered 2014-01-12 (×2): 30 mg via INTRAVENOUS
  Filled 2014-01-12: qty 1

## 2014-01-12 MED ORDER — LACTATED RINGERS IV SOLN
Freq: Once | INTRAVENOUS | Status: AC
  Administered 2014-01-12 (×2): via INTRAVENOUS

## 2014-01-12 MED ORDER — NALOXONE HCL 0.4 MG/ML IJ SOLN: 0.4000 mg | INTRAMUSCULAR | Status: DC | PRN

## 2014-01-12 MED ORDER — FERROUS SULFATE 325 (65 FE) MG PO TABS
325.0000 mg | ORAL_TABLET | Freq: Three times a day (TID) | ORAL | Status: DC
  Administered 2014-01-13 – 2014-01-16 (×9): 325 mg via ORAL
  Filled 2014-01-12 (×9): qty 1

## 2014-01-12 MED ORDER — ONDANSETRON HCL 4 MG/2ML IJ SOLN: 4.0000 mg | Freq: Three times a day (TID) | INTRAMUSCULAR | Status: DC | PRN

## 2014-01-12 MED ORDER — FENTANYL CITRATE 0.05 MG/ML IJ SOLN
INTRAMUSCULAR | Status: DC | PRN
  Administered 2014-01-12: 25 ug via INTRATHECAL
  Administered 2014-01-12: 50 ug via INTRAVENOUS
  Administered 2014-01-12: 25 ug via INTRAVENOUS

## 2014-01-12 MED ORDER — BUPIVACAINE IN DEXTROSE 0.75-8.25 % IT SOLN
INTRATHECAL | Status: DC | PRN
  Administered 2014-01-12: 1.2 mL via INTRATHECAL

## 2014-01-12 MED ORDER — ONDANSETRON HCL 4 MG/2ML IJ SOLN
4.0000 mg | INTRAMUSCULAR | Status: DC | PRN
  Administered 2014-01-13: 4 mg via INTRAVENOUS
  Filled 2014-01-12: qty 2

## 2014-01-12 MED ORDER — OXYCODONE-ACETAMINOPHEN 5-325 MG PO TABS
1.0000 | ORAL_TABLET | ORAL | Status: DC | PRN
  Administered 2014-01-13 – 2014-01-15 (×5): 1 via ORAL
  Filled 2014-01-12 (×5): qty 1

## 2014-01-12 MED ORDER — ONDANSETRON HCL 4 MG/2ML IJ SOLN
INTRAMUSCULAR | Status: DC | PRN
  Administered 2014-01-12: 4 mg via INTRAVENOUS

## 2014-01-12 MED ORDER — SCOPOLAMINE 1 MG/3DAYS TD PT72
MEDICATED_PATCH | TRANSDERMAL | Status: AC
  Administered 2014-01-12: 1.5 mg via TRANSDERMAL
  Filled 2014-01-12: qty 1

## 2014-01-12 MED ORDER — OXYTOCIN 40 UNITS IN LACTATED RINGERS INFUSION - SIMPLE MED: 62.5000 mL/h | INTRAVENOUS | Status: AC

## 2014-01-12 MED ORDER — TETANUS-DIPHTH-ACELL PERTUSSIS 5-2.5-18.5 LF-MCG/0.5 IM SUSP: 0.5000 mL | Freq: Once | INTRAMUSCULAR | Status: DC

## 2014-01-12 MED ORDER — CEFAZOLIN SODIUM-DEXTROSE 2-3 GM-% IV SOLR
2.0000 g | INTRAVENOUS | Status: AC
Start: 2014-01-12 — End: 2014-01-12
  Administered 2014-01-12: 2 g via INTRAVENOUS

## 2014-01-12 MED ORDER — SIMETHICONE 80 MG PO CHEW
80.0000 mg | CHEWABLE_TABLET | Freq: Three times a day (TID) | ORAL | Status: DC
  Administered 2014-01-13 – 2014-01-16 (×10): 80 mg via ORAL
  Filled 2014-01-12 (×9): qty 1

## 2014-01-12 MED ORDER — MEPERIDINE HCL 25 MG/ML IJ SOLN: 6.2500 mg | INTRAMUSCULAR | Status: DC | PRN

## 2014-01-12 MED ORDER — MENTHOL 3 MG MT LOZG: 1.0000 | LOZENGE | OROMUCOSAL | Status: DC | PRN

## 2014-01-12 MED ORDER — KETOROLAC TROMETHAMINE 30 MG/ML IJ SOLN: 30.0000 mg | Freq: Four times a day (QID) | INTRAMUSCULAR | Status: AC | PRN

## 2014-01-12 MED ORDER — DEXAMETHASONE SODIUM PHOSPHATE 10 MG/ML IJ SOLN
INTRAMUSCULAR | Status: DC | PRN
  Administered 2014-01-12: 5 mg via INTRAVENOUS

## 2014-01-12 MED ORDER — ONDANSETRON HCL 4 MG/2ML IJ SOLN
INTRAMUSCULAR | Status: AC
  Filled 2014-01-12: qty 2

## 2014-01-12 MED ORDER — ZOLPIDEM TARTRATE 5 MG PO TABS
5.0000 mg | ORAL_TABLET | Freq: Every evening | ORAL | Status: DC | PRN
  Administered 2014-01-13 – 2014-01-15 (×3): 5 mg via ORAL
  Filled 2014-01-12 (×3): qty 1

## 2014-01-12 MED ORDER — SENNOSIDES-DOCUSATE SODIUM 8.6-50 MG PO TABS
2.0000 | ORAL_TABLET | ORAL | Status: DC
  Administered 2014-01-12 – 2014-01-15 (×4): 2 via ORAL
  Filled 2014-01-12 (×4): qty 2

## 2014-01-12 MED ORDER — OXYTOCIN 10 UNIT/ML IJ SOLN
INTRAMUSCULAR | Status: AC
  Filled 2014-01-12: qty 4

## 2014-01-12 MED ORDER — FENTANYL CITRATE 0.05 MG/ML IJ SOLN: 25.0000 ug | INTRAMUSCULAR | Status: DC | PRN

## 2014-01-12 MED ORDER — DIBUCAINE 1 % RE OINT: 1.0000 "application " | TOPICAL_OINTMENT | RECTAL | Status: DC | PRN

## 2014-01-12 MED ORDER — SODIUM CHLORIDE 0.9 % IJ SOLN: 3.0000 mL | INTRAMUSCULAR | Status: DC | PRN

## 2014-01-12 MED ORDER — SIMETHICONE 80 MG PO CHEW: 80.0000 mg | CHEWABLE_TABLET | ORAL | Status: DC | PRN

## 2014-01-12 MED ORDER — ONDANSETRON HCL 4 MG PO TABS: 4.0000 mg | ORAL_TABLET | ORAL | Status: DC | PRN

## 2014-01-12 MED ORDER — MORPHINE SULFATE 0.5 MG/ML IJ SOLN
INTRAMUSCULAR | Status: AC
  Filled 2014-01-12: qty 10

## 2014-01-12 MED ORDER — LACTATED RINGERS IV SOLN
INTRAVENOUS | Status: DC
  Administered 2014-01-12 – 2014-01-13 (×2): via INTRAVENOUS

## 2014-01-12 MED ORDER — MIDAZOLAM HCL 2 MG/2ML IJ SOLN
INTRAMUSCULAR | Status: AC
  Filled 2014-01-12: qty 2

## 2014-01-12 MED ORDER — NALBUPHINE HCL 10 MG/ML IJ SOLN: 5.0000 mg | INTRAMUSCULAR | Status: DC | PRN

## 2014-01-12 MED ORDER — SIMETHICONE 80 MG PO CHEW
80.0000 mg | CHEWABLE_TABLET | ORAL | Status: DC
  Administered 2014-01-12 – 2014-01-15 (×4): 80 mg via ORAL
  Filled 2014-01-12 (×4): qty 1

## 2014-01-12 MED ORDER — CEFAZOLIN SODIUM-DEXTROSE 2-3 GM-% IV SOLR
INTRAVENOUS | Status: AC
  Filled 2014-01-12: qty 50

## 2014-01-12 SURGICAL SUPPLY — 18 items
CLAMP CORD UMBIL (MISCELLANEOUS) ×6 IMPLANT
DRSG OPSITE POSTOP 4X10 (GAUZE/BANDAGES/DRESSINGS) ×3 IMPLANT
DURAPREP 26ML APPLICATOR (WOUND CARE) ×3 IMPLANT
ELECT REM PT RETURN 9FT ADLT (ELECTROSURGICAL) ×3
ELECTRODE REM PT RTRN 9FT ADLT (ELECTROSURGICAL) ×1 IMPLANT
GLOVE BIOGEL PI IND STRL 6.5 (GLOVE) ×1 IMPLANT
GLOVE BIOGEL PI INDICATOR 6.5 (GLOVE) ×2
GLOVE SURG SS PI 6.0 STRL IVOR (GLOVE) ×3 IMPLANT
GOWN STRL REUS W/TWL LRG LVL3 (GOWN DISPOSABLE) ×9 IMPLANT
PACK C SECTION WH (CUSTOM PROCEDURE TRAY) ×3 IMPLANT
PAD ABD 8X7 1/2 STERILE (GAUZE/BANDAGES/DRESSINGS) ×3 IMPLANT
RTRCTR C-SECT PINK 25CM LRG (MISCELLANEOUS) ×3 IMPLANT
SPONGE GAUZE 4X4 12PLY STER LF (GAUZE/BANDAGES/DRESSINGS) ×3 IMPLANT
SUT VIC AB 0 CT1 36 (SUTURE) ×12 IMPLANT
SUT VIC AB 4-0 KS 27 (SUTURE) ×3 IMPLANT
TAPE CLOTH SURG 4X10 WHT LF (GAUZE/BANDAGES/DRESSINGS) ×3 IMPLANT
TOWEL OR 17X24 6PK STRL BLUE (TOWEL DISPOSABLE) ×6 IMPLANT
TRAY FOLEY CATH 14FR (SET/KITS/TRAYS/PACK) ×3 IMPLANT

## 2014-01-12 NOTE — Anesthesia Preprocedure Evaluation (Signed)
Anesthesia Evaluation  Patient identified by MRN, date of birth, ID band Patient awake    Reviewed: Allergy & Precautions, H&P , NPO status , Patient's Chart, lab work & pertinent test results  Airway Mallampati: III TM Distance: >3 FB Neck ROM: Full    Dental  (+) Teeth Intact   Pulmonary neg pulmonary ROS,  breath sounds clear to auscultation  Pulmonary exam normal       Cardiovascular negative cardio ROS  Rhythm:Regular Rate:Normal     Neuro/Psych negative neurological ROS  negative psych ROS   GI/Hepatic Neg liver ROS, GERD-  ,  Endo/Other  negative endocrine ROS  Renal/GU negative Renal ROS  negative genitourinary   Musculoskeletal negative musculoskeletal ROS (+)   Abdominal (+) - obese,   Peds  Hematology negative hematology ROS (+)   Anesthesia Other Findings   Reproductive/Obstetrics (+) Pregnancy Twin gestation Mono/di                           Anesthesia Physical Anesthesia Plan  ASA: II  Anesthesia Plan: Spinal   Post-op Pain Management:    Induction:   Airway Management Planned:   Additional Equipment:   Intra-op Plan:   Post-operative Plan:   Informed Consent: I have reviewed the patients History and Physical, chart, labs and discussed the procedure including the risks, benefits and alternatives for the proposed anesthesia with the patient or authorized representative who has indicated his/her understanding and acceptance.     Plan Discussed with: CRNA, Anesthesiologist and Surgeon  Anesthesia Plan Comments:         Anesthesia Quick Evaluation

## 2014-01-12 NOTE — Transfer of Care (Signed)
Immediate Anesthesia Transfer of Care Note  Patient: Jill [name]  Procedure(s) Performed: Procedure(s): CESAREAN SECTION MULTI-GESTATIONAL (N/A)  Patient Location: PACU  Anesthesia Type:Spinal  Level of Consciousness: awake, alert , oriented and patient cooperative; language interpreter present  Airway & Oxygen Therapy: Patient Spontanous Breathing  Post-op Assessment: Report given to PACU RN and Post -op Vital signs reviewed and stable  Post vital signs: Reviewed and stable  Complications: No apparent anesthesia complications

## 2014-01-12 NOTE — Anesthesia Procedure Notes (Signed)
Spinal  Patient location during procedure: OR Start time: 01/12/2014 11:20 AM Staffing Anesthesiologist: Jenna Routzahn A. Performed by: anesthesiologist  Preanesthetic Checklist Completed: patient identified, site marked, surgical consent, pre-op evaluation, timeout performed, IV checked, risks and benefits discussed and monitors and equipment checked Spinal Block Patient position: sitting Prep: site prepped and draped and DuraPrep Patient monitoring: heart rate, cardiac monitor, continuous pulse ox and blood pressure Approach: midline Location: L3-4 Injection technique: single-shot Needle Needle type: Sprotte  Needle gauge: 24 G Needle length: 9 cm Needle insertion depth: 4.5 cm Assessment Sensory level: T4 Additional Notes Patient tolerated procedure well. Adequate sensory block. Interpreter present throughout procedure.

## 2014-01-12 NOTE — Addendum Note (Signed)
Addendum created 01/12/14 1612 by Renford DillsJanet L Delphin Funes, CRNA   Modules edited: Notes Section   Notes Section:  File: [id number]

## 2014-01-12 NOTE — Lactation Note (Signed)
This note was copied from the chart of GirlA Lyndi More. Lactation Consultation Note  Initial visit made with interpreter present.  Providing Breastmilk for your Baby in NICU left with patient.  Husband reads English but not present.  Babies are 4 hours old.  Assisted mom with initiating pumping.  No milk obtained this first pumping.  Mom is very sleepy and teaching including hand expression will be needed when she is more alert.  Instructed to pump both breasts every 3 hours x 15 minutes.  I told mom she could go longer tonight to get some rest.  Explained supply and demand and milk coming to volume.  Will follow up tomorrow.  Patient Name: Jill [name] Reason for consult: Initial assessment   Maternal Data    Feeding    LATCH Score/Interventions                      Lactation Tools Discussed/Used Pump Review: Setup, frequency, and cleaning;Milk Storage Initiated by:: LMOULDEN RN,IBCLC Date initiated:: 01/12/14   Consult Status Consult Status: Follow-up Date: 01/13/14 Follow-up type: In-patient    Huston FoleyMOULDEN, Samah Lapiana S [name], 4:27 PM

## 2014-01-12 NOTE — Anesthesia Postprocedure Evaluation (Signed)
  Anesthesia Post-op Note  Patient: Jill [name]  Procedure(s) Performed: Procedure(s): CESAREAN SECTION MULTI-GESTATIONAL (N/A)  Patient Location: [facility]  Anesthesia Type:Spinal  Level of Consciousness: awake  Airway and Oxygen Therapy: Patient Spontanous Breathing  Post-op Pain: mild  Post-op Assessment: Patient's Cardiovascular Status Stable and Respiratory Function Stable  Post-op Vital Signs: stable  Last Vitals:  Filed Vitals:   01/12/14 1530  BP: 107/67  Pulse: 72  Temp: 36.8 C  Resp: 20    Complications: No apparent anesthesia complications

## 2014-01-12 NOTE — Op Note (Signed)
Entered in error, unable to delete

## 2014-01-12 NOTE — H&P (Signed)
Jill [name] is a [age] y.o. female G1P0 at 4834 weeks with mono-di twin pregnancy presenting for scheduled primary cesarean section secondary to fetal malpresentation (breech/breech). [facility] complicated by AMA, mono-di twin pregnancy with discordant growth, and language barrier. 01/05/2014 ultrasound demonstrated IUGR in fetus B. Following a course of betamethasone, delivery was recommended at 34 weeks. History OB History   Grav Para Term Preterm Abortions TAB SAB Ect Mult Living   1              Past Medical History  Diagnosis Date  . Medical history non-contributory   . Polyp of cervix    Past Surgical History  Procedure Laterality Date  . No past surgeries     Family History: family history includes Diabetes in her mother; Heart disease in her mother. Social History:  reports that she has never smoked. She has never used smokeless tobacco. She reports that she does not drink alcohol or use illicit drugs.   Prenatal Transfer Tool  Maternal Diabetes: No Genetic Screening: Declined Maternal Ultrasounds/Referrals: Normal Fetal Ultrasounds or other Referrals:  Other: IUGR in baby B Maternal Substance Abuse:  No Significant Maternal Medications:  None Significant Maternal Lab Results:  None Other Comments:  None  Review of Systems  All other systems reviewed and are negative.     Blood pressure 151/75, pulse 118, temperature 98.1 F (36.7 C), temperature source Oral, resp. rate 18, last menstrual period 05/19/2013, SpO2 100.00%. Exam Physical Exam  GENERAL: Well-developed, well-nourished female in no acute distress.  HEENT: Normocephalic, atraumatic. Sclerae anicteric.  NECK: Supple. Normal thyroid.  LUNGS: Clear to auscultation bilaterally.  HEART: Regular rate and rhythm. ABDOMEN: Soft, nontender, gravid PELVIC: Not indicated EXTREMITIES: No cyanosis, clubbing, or edema, 2+ distal pulses.  Prenatal labs: ABO, Rh:  --/--/B POS (10/20 1540) Antibody: NEG (10/20 1540) Rubella:   RPR: NON REAC (10/20 1540)  HBsAg: NEGATIVE (05/05 1447)  HIV: NONREACTIVE (09/03 1350)  GBS:     Assessment/Plan: 43 yo G1P0 at 34 weeks with mono-di twins with IUGR in baby B here for scheduled primary cesarean section secondary to fetal malpresentation (breech/breech) - Risks and benefits were explained including but not limited to risks of bleeding, infection and damage to adjacent organs. Patient verbalized understanding and all questions were answered. Patient was counseled in the presence of a Farsi interpreter   Jill [name] 01/12/2014, 10:33 AM

## 2014-01-12 NOTE — Anesthesia Postprocedure Evaluation (Signed)
  Anesthesia Post-op Note  Patient: Jill [name]  Procedure(s) Performed: Procedure(s): CESAREAN SECTION MULTI-GESTATIONAL (N/A)  Patient Location: PACU  Anesthesia Type:Spinal  Level of Consciousness: awake, alert  and oriented  Airway and Oxygen Therapy: Patient Spontanous Breathing  Post-op Pain: none  Post-op Assessment: Post-op Vital signs reviewed, Patient's Cardiovascular Status Stable, Respiratory Function Stable, Patent Airway, No signs of Nausea or vomiting, Pain level controlled, No headache and No backache  Post-op Vital Signs: Reviewed and stable  Last Vitals:  Filed Vitals:   01/12/14 1300  BP:   Pulse: 65  Temp:   Resp: 16    Complications: No apparent anesthesia complications

## 2014-01-13 DIAGNOSIS — Z98891 History of uterine scar from previous surgery: Secondary | ICD-10-CM

## 2014-01-13 LAB — CBC
HEMATOCRIT: 29.6 % — AB (ref 36.0–46.0)
Hemoglobin: 10.3 g/dL — ABNORMAL LOW (ref 12.0–15.0)
MCH: 33.6 pg (ref 26.0–34.0)
MCHC: 34.8 g/dL (ref 30.0–36.0)
MCV: 96.4 fL (ref 78.0–100.0)
PLATELETS: 148 10*3/uL — AB (ref 150–400)
RBC: 3.07 MIL/uL — ABNORMAL LOW (ref 3.87–5.11)
RDW: 13.2 % (ref 11.5–15.5)
WBC: 14.4 10*3/uL — ABNORMAL HIGH (ref 4.0–10.5)

## 2014-01-13 MED ORDER — BISACODYL 10 MG RE SUPP
10.0000 mg | Freq: Every day | RECTAL | Status: DC | PRN
  Administered 2014-01-13: 10 mg via RECTAL
  Filled 2014-01-13: qty 1

## 2014-01-13 MED ORDER — HYDROMORPHONE HCL 1 MG/ML IJ SOLN
1.0000 mg | INTRAMUSCULAR | Status: DC | PRN
  Administered 2014-01-13: 2 mg via INTRAVENOUS
  Filled 2014-01-13: qty 2

## 2014-01-13 NOTE — Lactation Note (Addendum)
This note was copied from the chart of Jill Dalyn Labarre. Lactation Consultation Note    Follow up consult with this mom of 34 week twins, now 25 hours old. Mom speaks fharsi, dad in room, speaks english and has consented to act as an interpreter. On exam, mom has normal appearing breasts, soft, with easily expressed drops of colostrum. Breasts are very tender. Supply and demand reviewed with parents - mom encouraged to pump in premie setting, every 3 hours - times written on chalk board. Dad asked to bring colostrum to the nICU, and to get EBM labels. I faxed infor to [facility] for mom - she is active - for DEP. Mom and dad know to call for questions/concerns.    Dad spoke to Eva from WIC and set up an appointment for mom to get DEP on Monday, 10/26.  Patient Name: Jill [name] Today's Date: 01/13/2014 Reason for consult: Follow-up assessment;NICU baby;Multiple gestation;Late preterm infant;Infant < 6lbs   Maternal Data Formula Feeding for Exclusion: Yes (babies in NICU) Has patient been taught Hand Expression?: Yes Does the patient have breastfeeding experience prior to this delivery?: No  Feeding    LATCH Score/Interventions                      Lactation Tools Discussed/Used WIC Program: Yes (info faxed to WIC for DEP) Pump Review: Setup, frequency, and cleaning;Milk Storage;Other (comment) (hand expression, premie setting) Initiated by:: laura molden Date initiated:: 01/12/14   Consult Status Consult Status: Follow-up Date: 01/14/14 Follow-up type: In-patient    Jill [name] 01/13/2014, 1:24 PM    

## 2014-01-13 NOTE — Progress Notes (Signed)
Subjective: Postpartum Day 1: Scheduled cesarean Delivery of mono-di twins in breech/breech presentation at 34 weeks; IUGR of fetus B.   Patient reports incisional pain, tolerating PO and no flatus yet.  Foley recently removed, no spontaneous void yet.  Babies are doing well in NICU. Breastfeeding. Undecided about BCM.  Objective: Vital signs in last 24 hours: Temp:  [97.6 F (36.4 C)-99.3 F (37.4 C)] 98.3 F (36.8 C) (10/23 0541) Pulse Rate:  [62-118] 62 (10/23 0541) Resp:  [14-23] 18 (10/23 0541) BP: (92-151)/(48-75) 92/57 mmHg (10/23 0541) SpO2:  [96 %-100 %] 97 % (10/23 0541) Weight:  [135 lb (61.236 kg)] 135 lb (61.236 kg) (10/22 1430) 10/22 0701 - 10/23 0700 In: 4326.8 [P.O.:720; I.V.:3606.8] Out: 2700 [Urine:1800; Blood:900]  Physical Exam:  General: alert and no distress Lochia: appropriate Uterine Fundus: firm Incision: no significant drainage, no significant erythema, dressing in place DVT Evaluation: No evidence of DVT seen on physical exam. Negative Homan's sign.   Recent Labs  01/10/14 1540 01/13/14 0520  HGB 11.7* 10.3*  HCT 33.1* 29.6*    Assessment/Plan: Status post Cesarean section. Doing well postoperatively. Encourage OOB   Continue routine postpartum care   Jenney Brester A, MD 01/13/2014, 6:50 AM

## 2014-01-14 ENCOUNTER — Encounter (HOSPITAL_COMMUNITY): Payer: Self-pay | Admitting: Obstetrics and Gynecology

## 2014-01-14 NOTE — Plan of Care (Signed)
Problem: Phase I Progression Outcomes Goal: Foley catheter patent Outcome: Not Progressing Voiding frequent small amounts.Bladder scanned for 999 cc urine.Foley # 14 inserted and 950 cc urine returned.Patient tolerated well. Goal: OOB as tolerated unless otherwise ordered Outcome: Completed/Met Date Met:  01/14/14 Tolerated walking in halls well

## 2014-01-14 NOTE — Progress Notes (Signed)
Husband explained that patient has been voiding small amounts and still feels that she needs to urinate.Measuring hat replaced and patient voided 200 cc clear yellow urine.Patient states this time she does not feel like she needs to urinate.Bladder scan performed and scanner says there is 999 left in the bladder.Scan performed x 2 with same result.Dr Emelda FearFerguson notified and order to insert Foley and leave until the AM given.#14 fr Foley catheter inserted using sterile technique and 950 cc clear yellow was drained.Foley was clamped for a short period to allow bladder to rest.Patient tolerated procedure very well.

## 2014-01-14 NOTE — Progress Notes (Signed)
Subjective: Postpartum Day 2: Cesarean Delivery Eating, drinking, voiding, ambulating well.  +flatus.  Lochia and pain wnl.  Denies dizziness, lightheadedness, or sob. No complaints.   Objective: Vital signs in last 24 hours: Temp:  [97.7 F (36.5 C)-98.3 F (36.8 C)] 97.7 F (36.5 C) (10/24 0534) Pulse Rate:  [64-81] 78 (10/24 0534) Resp:  [16-20] 17 (10/24 0534) BP: (103-122)/(55-74) 113/55 mmHg (10/24 0534) SpO2:  [96 %] 96 % (10/24 0534)  Physical Exam:  General: alert, cooperative and no distress Lochia: appropriate Uterine Fundus: firm Incision: healing well, no significant drainage, no dehiscence, no significant erythema, honeycomb dressing intact DVT Evaluation: No evidence of DVT seen on physical exam. Negative Homan's sign. No cords or calf tenderness. No significant calf/ankle edema.   Recent Labs  01/13/14 0520  HGB 10.3*  HCT 29.6*    Assessment/Plan: Status post Cesarean section. Doing well postoperatively.  Plan for d/c tomorrow. Breast/bottlefeeding Undecided about contraception  Marge DuncansBooker, Jamilex Bohnsack Randall 01/14/2014, 10:38 AM

## 2014-01-14 NOTE — Lactation Note (Signed)
This note was copied from the chart of Jill Namita Rauen. Lactation Consultation Note  Patient Name: Jill [name] Today's Date: 01/14/2014 Reason for consult: Follow-up assessment;NICU baby  Follow-up visit; NICU baby GA 34.0; P1.  Last 2 pumping mom has pumped 1-2 ml colostrum in refrigerator; reports pumping every 3 hours.  Reviewed NICU booklet and the need to keep pumping log in booklet shown to parents.  Reviewed the need to pump at least 8 times per day and once during the night.  Dad interpreting for mom although mom can understand some English.  WIC Referral faxed on 10/23.  Dad said they will be picking up pump from WIC on Monday.    Lactation Tools Discussed/Used WIC Program: Yes   Consult Status Consult Status: Follow-up Follow-up type: In-patient    Reva Pinkley Walker 01/14/2014, 9:17 PM    

## 2014-01-15 MED ORDER — NITROFURANTOIN MONOHYD MACRO 100 MG PO CAPS
100.0000 mg | ORAL_CAPSULE | Freq: Every day | ORAL | Status: DC
  Administered 2014-01-15 – 2014-01-16 (×2): 100 mg via ORAL
  Filled 2014-01-15 (×3): qty 1

## 2014-01-15 MED ORDER — LANOLIN HYDROUS EX OINT: 1.0000 "application " | TOPICAL_OINTMENT | CUTANEOUS | Status: DC | PRN

## 2014-01-15 MED ORDER — PHENAZOPYRIDINE HCL 200 MG PO TABS
200.0000 mg | ORAL_TABLET | Freq: Three times a day (TID) | ORAL | Status: DC
  Administered 2014-01-15 – 2014-01-16 (×4): 200 mg via ORAL
  Filled 2014-01-15 (×7): qty 1

## 2014-01-15 MED ORDER — IBUPROFEN 600 MG PO TABS: 600.0000 mg | ORAL_TABLET | Freq: Four times a day (QID) | ORAL | Status: DC

## 2014-01-15 MED ORDER — OXYCODONE-ACETAMINOPHEN 5-325 MG PO TABS: 1.0000 | ORAL_TABLET | ORAL | Status: DC | PRN

## 2014-01-15 NOTE — Plan of Care (Signed)
Problem: Phase I Progression Outcomes Goal: Other Phase I Outcomes/Goals Outcome: Completed/Met Date Met:  01/15/14 Continues to have some urinary retention so Foley was reinserted.  Problem: Phase II Progression Outcomes Goal: Progress activity as tolerated unless otherwise ordered Outcome: Completed/Met Date Met:  01/15/14 Walked in the hall and tolerated well.Encouraged to walk more frequent.  Problem: Discharge Progression Outcomes Goal: Pain controlled with appropriate interventions Outcome: Completed/Met Date Met:  01/15/14 Pain controlled on po Percocet and  Motrin.

## 2014-01-15 NOTE — Discharge Summary (Addendum)
Obstetric Discharge Summary Reason for Admission: cesarean section Prenatal Procedures: NST and ultrasound Intrapartum Procedures: cesarean: low cervical, transverse Postpartum Procedures: none Complications-Operative and Postpartum: urinary retention Hemoglobin  Date Value Ref Range Status  01/13/2014 10.3* 12.0 - 15.0 g/dL Final     HCT  Date Value Ref Range Status  01/13/2014 29.6* 36.0 - 46.0 % Final    Physical Exam:  General: no distress Lochia: appropriate Uterine Fundus: firm Incision: healing well, no significant drainage DVT Evaluation: No evidence of DVT seen on physical exam.  Discharge Diagnoses: 34 week mono-di twin gestation with discordant growth delivered by cesarean section  Discharge Information: Date: 01/15/2014 Activity: pelvic rest and no heavy lifting Diet: routine Medications: Ibuprofen and Percocet Condition: stable Instructions: refer to practice specific booklet Discharge to: home Follow-up Information   Follow up with [facility] In 6 weeks.   Contact information:   [address] [place] [address] [phone]       Newborn Data:   Estanislado SpireSharifi, GirlA Kasy [[id number]][[id number]]  Live born female  Birth Weight: 4 lb 5.8 oz (1979 g) APGAR: 8, 8   Exie ParodySharifi, GirlB Latarsha [[id number]][[id number]]  Live born female  Birth Weight: 3 lb 8.8 oz (1610 g) APGAR: 8, 9  NICU  ARNOLD,JAMES 01/15/2014, 7:43 AM   Addendum:  Postoperative/postpartum course complicated by urinary retention s/p two unsuccessful voiding trials. Foley replaced 01/15/14 morning after patient voided 50 ml and bladder scan revealed 500 ml bladder residual. She was started on Pyridium and Macrobid UTI prophylaxis.  Converted to leg bag on 01/16/14; home heath care arranged with help of Case Management.  Patient will follow up in clinic for trial of void in 3-4 days.    Tereso NewcomerUgonna A Letizia Hook, MD 01/16/2014 6:58 AM

## 2014-01-15 NOTE — Plan of Care (Signed)
Problem: Discharge Progression Outcomes Goal: Complications resolved/controlled Outcome: Not Progressing Re-insert Foley catheter, urinary retention.

## 2014-01-16 DIAGNOSIS — R339 Retention of urine, unspecified: Secondary | ICD-10-CM | POA: Diagnosis not present

## 2014-01-16 MED ORDER — PHENAZOPYRIDINE HCL 200 MG PO TABS: 200.0000 mg | ORAL_TABLET | Freq: Three times a day (TID) | ORAL | Status: DC

## 2014-01-16 MED ORDER — DOCUSATE SODIUM 100 MG PO CAPS: 100.0000 mg | ORAL_CAPSULE | Freq: Two times a day (BID) | ORAL | Status: DC | PRN

## 2014-01-16 MED ORDER — NITROFURANTOIN MONOHYD MACRO 100 MG PO CAPS: 100.0000 mg | ORAL_CAPSULE | Freq: Every day | ORAL | Status: DC

## 2014-01-16 NOTE — Op Note (Signed)
Allye Behrmann PROCEDURE DATE: 01/12/2014  PREOPERATIVE DIAGNOSIS: Intrauterine pregnancy at  3326w0d weeks gestation with mono-di twins and evidence of fetal growth restrictions in twin B for primary cesarean sections secondary to fetal malpresentation: breech/breech  POSTOPERATIVE DIAGNOSIS: The same  PROCEDURE:     Cesarean Section  SURGEON:  Dr. Catalina AntiguaPeggy Xayden Linsey  ASSISTANT: Dr. Burnetta SabinKristi Acosta  INDICATIONS: Jill PughKhujista Grewe is a 43 y.o. [id number] at 326w0d scheduled for cesarean section secondary to malpresentation: breech/breech.  Patient with mono-do twins with evidence of IUGR in twin B. The risks of cesarean section discussed with the patient included but were not limited to: bleeding which may require transfusion or reoperation; infection which may require antibiotics; injury to bowel, bladder, ureters or other surrounding organs; injury to the fetus; need for additional procedures including hysterectomy in the event of a life-threatening hemorrhage; placental abnormalities wth subsequent pregnancies, incisional problems, thromboembolic phenomenon and other postoperative/anesthesia complications. The patient concurred with the proposed plan, giving informed written consent for the procedure.    FINDINGS:  Viable female/female infant in breech/cephalic presentation.  Baby A Apgars 8 and 8, weight, 4 pounds and 6 ounces; baby B: Apgars 8 and 9, weight, 3 pounds and 9 ounces .  Clear amniotic fluid x 2.  Intact placenta, three vessel cord x 2.  Normal uterus, fallopian tubes and ovaries bilaterally.  ANESTHESIA:    Spinal INTRAVENOUS FLUIDS:2000 ml ESTIMATED BLOOD LOSS: 900 ml URINE OUTPUT:  300 ml SPECIMENS: Placenta sent to pathology COMPLICATIONS: None immediate  PROCEDURE IN DETAIL:  The patient received intravenous antibiotics and had sequential compression devices applied to her lower extremities while in the preoperative area.  She was then taken to the operating room where anesthesia  was induced and was found to be adequate. A foley catheter was placed into her bladder and attached to Geoge Lawrance gravity. She was then placed in a dorsal supine position with a leftward tilt, and prepped and draped in a sterile manner. After an adequate timeout was performed, a Pfannenstiel skin incision was made with scalpel and carried through to the underlying layer of fascia. The fascia was incised in the midline and this incision was extended bilaterally using the Mayo scissors. Kocher clamps were applied to the superior aspect of the fascial incision and the underlying rectus muscles were dissected off bluntly. A similar process was carried out on the inferior aspect of the facial incision. The rectus muscles were separated in the midline bluntly and the peritoneum was entered bluntly. The Alexis self-retaining retractor was introduced into the abdominal cavity. Attention was turned to the lower uterine segment where a transverse hysterotomy was made with a scalpel and extended bilaterally bluntly. The infants were successfully delivered, and cords were clamped and cut and the infants were handed over to awaiting neonatology team. Uterine massage was then administered and the placenta delivered intact with three-vessel cord. The uterus was cleared of clot and debris.  The hysterotomy was closed with 0 Vicryl in a running locked fashion, and an imbricating layer was also placed with a 0 Vicryl. Overall, excellent hemostasis was noted. The pelvis copiously irrigated and cleared of all clot and debris. Hemostasis was confirmed on all surfaces.  The peritoneum and the muscles were reapproximated using 0 vicryl interrupted stitches. The fascia was then closed using 0 Vicryl in a running locked fashion.  The skin was closed in a subcuticular fashion using 3.0 Vicryl. The patient tolerated the procedure well. Sponge, lap, instrument and needle counts were correct x 2. She  was taken to the recovery room in stable  condition.    Alexio Sroka,PEGGYMD  01/16/2014 1:21 PM

## 2014-01-16 NOTE — Progress Notes (Signed)
CM called [facility] # [phone]- Baxter HireKristen with [facility] to see if [facility] could be [facility] for foley catheter care at home.  Per Baxter HireKristen with [facility]- foley catheter care is not covered with insurance [facility] unless patient has another need.  CM called Dr Jolayne Pantheronstant - to notify her that [facility].  Dr. Jolayne Pantheronstant stated she did not have another skilled need to [facility].  Dr. Caroll Rancherontant requests that RN on unit teaches patient how to use leg bag and foley catheter at home. Brianna on nursing unit notified.

## 2014-01-16 NOTE — Discharge Instructions (Signed)
Cesarean Delivery  Cesarean delivery is the birth of a baby through a cut (incision) in the abdomen and womb (uterus).  LET YOUR HEALTH CARE PROVIDER KNOW ABOUT:  All medicines you are taking, including vitamins, herbs, eye drops, creams, and over-the-counter medicines.  Previous problems you or members of your family have had with the use of anesthetics.  Any blood disorders you have.  Previous surgeries you have had.  Medical conditions you have.  Any allergies you have.  Complicationsinvolving the pregnancy. RISKS AND COMPLICATIONS  Generally, this is a safe procedure. However, as with any procedure, complications can occur. Possible complications include:  Bleeding.  Infection.  Blood clots.  Injury to surrounding organs.  Problems with anesthesia.  Injury to the baby. BEFORE THE PROCEDURE   You may be given an antacid medicine to drink. This will prevent acid contents in your stomach from going into your lungs if you vomit during the surgery.  You may be given an antibiotic medicine to prevent infection. PROCEDURE   Hair may be removed from your pubic area and your lower abdomen. This is to prevent infection in the incision site.  A tube (Foley catheter) will be placed in your bladder to drain your urine from your bladder into a bag. This keeps your bladder empty during surgery.  An IV tube will be placed in your vein.  You may be given medicine to numb the lower half of your body (regional anesthetic). If you were in labor, you may have already had an epidural in place which can be used in both labor and cesarean delivery. You may possibly be given medicine to make you sleep (general anesthetic) though this is not as common.  An incision will be made in your abdomen that extends to your uterus. There are 2 basic kinds of incisions:  The horizontal (transverse) incision. Horizontal incisions are from side to side and are used for most routine cesarean  deliveries.  The vertical incision. The vertical incision is from the top of the abdomen to the bottom and is less commonly used. It is often done for women who have a serious complication (extreme prematurity) or under emergency situations.  The horizontal and vertical incisions may both be used at the same time. However, this is very uncommon.  An incision is then made in your uterus to deliver the baby.  Your baby will then be delivered.  Both incisions are then closed with absorbable stitches. AFTER THE PROCEDURE   If you were awake during the surgery, you will see your baby right away. If you were asleep, you will see your baby as soon as you are awake.  You may breastfeed your baby after surgery.  You may be able to get up and walk the same day as the surgery. If you need to stay in bed for a period of time, you will receive help to turn, cough, and take deep breaths after surgery. This helps prevent lung problems such as pneumonia.  Do not get out of bed alone the first time after surgery. You will need help getting out of bed until you are able to do this by yourself.  You may be able to shower the day after your cesarean delivery. After the bandage (dressing) is taken off the incision site, a nurse will assist you to shower if you would like help.  You will have pneumatic compression hose placed on your lower legs. This is done to prevent blood clots. When you are up   and walking regularly, they will no longer be necessary.  Do not cross your legs when you sit.  Save any blood clots that you pass. If you pass a clot while on the toilet, do not flush it. Call for the nurse. Tell the nurse if you think you are bleeding too much or passing too many clots.  You will be given medicine as needed. Let your health care providers know if you are hurting. You may also be given an antibiotic to prevent an infection.  Your IV tube will be taken out when you are drinking a reasonable  amount of fluids. The Foley catheter is taken out when you are up and walking.  If your blood type is Rh negative and your baby's blood type is Rh positive, you will be given a shot of anti-D immune globulin. This shot prevents you from having Rh problems with a future pregnancy. You should get the shot even if you had your tubes tied (tubal ligation).  If you are allowed to take the baby for a walk, place the baby in the bassinet and push it. Do not carry your baby in your arms. Document Released: 03/10/2005 Document Revised: 12/29/2012 Document Reviewed: 09/29/2012 Exit[employer] Patient Information 2015 [employer]. This information is not intended to replace advice given to you by your health care provider. Make sure you discuss any questions you have with your health care provider.  

## 2014-01-16 NOTE — Lactation Note (Signed)
This note was copied from the chart of Jill [name]. Lactation Consultation Note  Patient Name: Jill Jill [name] Today's Date: 01/16/2014 Reason for consult: Follow-up assessment;NICU baby;Infant < 6lbs;Multiple gestation;Late preterm infant Mom was pumping when I arrived. Repositioned Mom to be sitting up and she was reclining with pumping. Mom is becoming engorged, nodules present in outer quadrants of breast and inner aspect of breast. Demonstrated to parents how to massage with pumping to help breasts empty well. Also reviewed the importance of sitting up to drain breast well. Flange changed to size 27 from 21 but some swelling noted after pumping. Advised to try flange size 24 with next pumping, but if uncomfortable use size 27 and these were more comfortable for Mom. Advised to apply EBM for nipple tenderness, no breakdown noted. Engorgement care reviewed with parents. RN to get Mom ice packs before d/c home. Mom is getting pump from WIC today. Reviewed storage guidelines with parents. Stressed importance of consistent pumping to prevent engorgement and protect milk supply. Written engorgement plan with pumping schedule given to parents. FOB interprets for Mom.   Maternal Data    Feeding Feeding Type: Formula Length of feed: 30 min  LATCH Score/Interventions          Comfort (Breast/Nipple): Engorged, cracked, bleeding, large blisters, severe discomfort Problem noted: Engorgment Intervention(s): Ice           Lactation Tools Discussed/Used Tools: Pump Breast pump type: Double-Electric Breast Pump WIC Program: Yes   Consult Status Consult Status: Complete Date: 01/16/14 Follow-up type: In-patient    Monik Lins Ann 01/16/2014, 11:17 AM    

## 2014-01-16 NOTE — Progress Notes (Signed)
Subjective: Postpartum Day 4: Scheduled cesarean Delivery of mono-di twins in breech/breech presentation at 34 weeks; IUGR of fetus B. Postpartum course complicated by urinary retention s/p two unsuccessful voiding trials.  Foley replaced yesterday morning after patient voided 50 ml and bladder scan revealed 500 ml bladder residual.  She was started on Pyridium and Macrobid UTI prophylaxis.  Patient reports incisional pain, tolerating PO and has flatus. She is OOB but not as much as instructed. Babies are doing well in NICU. Breastfeeding. Undecided about BCM.  Husband is concerned about home care of his wife with indwelling foley catheter.  Objective: Vital signs in last 24 hours: Temp:  [98 F (36.7 C)-98.3 F (36.8 C)] 98.3 F (36.8 C) (10/25 2158) Pulse Rate:  [71-88] 72 (10/25 2158) Resp:  [16-18] 18 (10/25 2158) BP: (106-120)/(66-78) 119/69 mmHg (10/25 2158) SpO2:  [97 %-100 %] 99 % (10/25 2158) 10/25 0701 - 10/26 0700 In: 720 [P.O.:720] Out: 1825 [Urine:1825]  Physical Exam:  General: alert and no distress Lochia: appropriate Uterine Fundus: firm Incision: no significant drainage, no significant erythema, dressing in place DVT Evaluation: No evidence of DVT seen on physical exam. Negative Homan's sign.  Assessment/Plan: Status post Cesarean section complicated by urinary retention Will switch foley to leg bag and give patient teaching; also arrange for home health help and outpatient follow up/in clinic trial of void.  Will continue Macrobid for UTI prophylaxis. Encourage OOB   Continue routine postpartum care Discharge later today with home health care   [facility] FacilityNYANWU,Rachel Rison A, MD 01/16/2014, 5:55 AM

## 2014-01-16 NOTE — Plan of Care (Signed)
Problem: Phase I Progression Outcomes Goal: Foley catheter patent Outcome: Completed/Met Date Met:  01/16/14 Foley is patent and draining large amounts of clear orange urine.Foley has been re-inserted due to continued urinary retention.Patient placed on Pyridium.

## 2014-01-16 NOTE — Lactation Note (Signed)
This note was copied from the chart of Jill Merrisa Roedl. Lactation Consultation Note    Follow up consult with this mom of NICU twins, now 4 days old, and 34 4/7 weeks CGA. Mom is being discharged to home today. On exam, har breasts have multiple hard ducts of milk, slightly engorged. I gave her ice packs, and had her pump in standard setting, for about 25 minutes, until she stopped dripping. Mom was able to express 45-50 mls. I helped mom pump with massage, which was painful for mom, but her breast did soften , except for the lateral side of her left breast. I wrote out a d/c plan for breast care and pumping for mom. Dad very concerned about how mom will be abo to do all that is involved, once home. I and her nurse told mom and dad that the more mom gets up and walks, the better she will feel. They know to have there babies' nurses call fro lactation as needed.   Patient Name: Jill [name] Today's Date: 01/16/2014 Reason for consult: Follow-up assessment;NICU baby;Late preterm infant;Multiple gestation   Maternal Data    Feeding Feeding Type: Formula Length of feed: 30 min  LATCH Score/Interventions          Comfort (Breast/Nipple): Engorged, cracked, bleeding, large blisters, severe discomfort Problem noted: Engorgment Intervention(s): Ice;Reverse pressure           Lactation Tools Discussed/Used Tools: Pump Breast pump type: Double-Electric Breast Pump WIC Program: Yes (appointment at 1430 today for DEP) Pump Review: Setup, frequency, and cleaning;Other (comment) (engorgement care)   Consult Status Consult Status: PRN Date: 01/16/14 Follow-up type: In-patient (NICU)    Jill [name] 01/16/2014, 1:26 PM    

## 2014-01-16 NOTE — Progress Notes (Signed)
Discharge teaching complete. Pt and family were explained instructions and demonstrated how to empty catheter leg bag at home. Dr. Jolayne Pantheronstant, Lactation, and nurse explained and answered all questions the pt and family had about caring for the catheter at home, breastfeeding, medications and appointments. Pt and family understood all instructions.

## 2014-01-19 ENCOUNTER — Ambulatory Visit: Payer: Medicaid Other | Admitting: Obstetrics & Gynecology

## 2014-01-19 VITALS — BP 118/71 | HR 79 | Temp 98.2°F | Wt 112.7 lb

## 2014-01-19 DIAGNOSIS — K5900 Constipation, unspecified: Secondary | ICD-10-CM

## 2014-01-19 MED ORDER — BISACODYL 10 MG RE SUPP: 10.0000 mg | RECTAL | Status: DC | PRN

## 2014-01-19 NOTE — Progress Notes (Signed)
Voided 175 cc at 3:25pm then used bladder scan- found 284 cc, reported to Dr. Debroah LoopArnold and patient may leave since she was able to void. Patient also c/o constipation- states has not had a good bowel movement since went home- only one small piece.  Reported to Dr. Debroah LoopArnold and dulcolax suppository ordered.  Also instructed patient to continue to drink fluids, fresh vegetables , fruit, bran and ambulate.Ihstructed patient to take ibuprofen every 6 hours if needed, and percocet only as needed to lessen constipation.  Instructed if problems with voiding to come to MAU. Also instructed if problems with severe pain to come to MAU or call office if office hours.

## 2014-01-19 NOTE — Progress Notes (Signed)
Interpreter at bedside along with husband. Foley catheter and leg bag removed at 1:55pm and 120cc orange tinged urine emptied from leg bag( pt. On pyridium) without difficulty. Instructed patient to drink water and when ready to void let us know so we can provide container to catch and measure urine then we will use bladder scan to see if she emptied her bladder.  Patient also asked if I would check her incision. Incision still covered with honeycomb dressing- per Dr. Debroah LoopArnold should come off on day 3 which is today . I removed dressing per patient request. Incision clean , dry , intact with scant, old dark red drainage on dressing. Instructed patient in would care.

## 2014-01-23 ENCOUNTER — Encounter (HOSPITAL_COMMUNITY): Payer: Self-pay | Admitting: Obstetrics and Gynecology

## 2014-01-26 NOTE — Patient Instructions (Signed)
Return to clinic for any scheduled appointments or for any gynecologic concerns as needed.   

## 2014-02-03 ENCOUNTER — Telehealth: Payer: Self-pay

## 2014-02-03 ENCOUNTER — Inpatient Hospital Stay (HOSPITAL_COMMUNITY)
Admission: AD | Admit: 2014-02-03 | Discharge: 2014-02-03 | Disposition: A | Discharge status: Home / Self Care | Payer: Medicaid Other | Source: Ambulatory Visit | Attending: Obstetrics and Gynecology | Admitting: Obstetrics and Gynecology

## 2014-02-03 DIAGNOSIS — M79605 Pain in left leg: Secondary | ICD-10-CM | POA: Diagnosis not present

## 2014-02-03 DIAGNOSIS — R200 Anesthesia of skin: Secondary | ICD-10-CM | POA: Diagnosis not present

## 2014-02-03 DIAGNOSIS — M549 Dorsalgia, unspecified: Secondary | ICD-10-CM | POA: Insufficient documentation

## 2014-02-03 NOTE — MAU Note (Signed)
Back huting.  Problem with preg also. Hurts to sit/stand.  ? Numbness in incisional area..  Incision is clean, dry and intact. No redness, edema or hot /tender to touch.  Babies are doing well, twins- bottle feeding.

## 2014-02-03 NOTE — Telephone Encounter (Signed)
Called back again and left message that if Jill [name] is having problems related to her C/S or severe pain she should go to MAU for evaluation. The office is closed at this time.

## 2014-02-03 NOTE — Telephone Encounter (Signed)
Patient's husband called stating he is calling on behalf of his wife because she has been having a problem with her back and her stomach where she had the C-section is hurting really badly. Attempted to contact patient. No answer. Left message stating we are returning your call, please call clinic.

## 2014-02-03 NOTE — MAU Provider Note (Signed)
History     CSN: [id number]  Arrival date and time: 02/03/14 1508   First Provider Initiated Contact with Patient 02/03/14 1603      Chief Complaint  Patient presents with  . Post-op Problem   HPI G1P2 s/p pLTCS POD # 18 here with complaints of numbness on abdomen.  Concerned this is not normal - no nausea/vomiting, +BM, no fevers/sweats  Left leg pain: oxycodone helps. No increased swelling, redness.  Back pain: oxycodone helps, has not taken any other medications.  Has not tried flexeril, ibuprofen or tylenol.    Past Medical History  Diagnosis Date  . Medical history non-contributory   . Polyp of cervix     Past Surgical History  Procedure Laterality Date  . No past surgeries    . Cesarean section multi-gestational N/A 01/12/2014    Procedure: CESAREAN SECTION MULTI-GESTATIONAL;  Surgeon: Catalina AntiguaPeggy Constant, MD;  Location: [facility];  Service: Obstetrics;  Laterality: N/A;    Family History  Problem Relation Age of Onset  . Diabetes Mother   . Heart disease Mother     History  Substance Use Topics  . Smoking status: Never Smoker   . Smokeless tobacco: Never Used  . Alcohol Use: No    Allergies: No Known Allergies  Prescriptions prior to admission  Medication Sig Dispense Refill Last Dose  . bisacodyl (DULCOLAX) 10 MG suppository Place 1 suppository (10 mg total) rectally as needed for moderate constipation. 6 suppository 1   . cyclobenzaprine (FLEXERIL) 10 MG tablet Take 1 tablet (10 mg total) by mouth 3 (three) times daily as needed for muscle spasms. 30 tablet 2 01/11/2014 at Unknown time  . docusate sodium (COLACE) 100 MG capsule Take 1 capsule (100 mg total) by mouth 2 (two) times daily as needed for mild constipation or moderate constipation. 30 capsule 2   . doxylamine, Sleep, (UNISOM) 25 MG tablet Take 25 mg by mouth at bedtime.   01/11/2014 at Unknown time  . ibuprofen (ADVIL,MOTRIN) 600 MG tablet Take 1 tablet (600 mg total) by mouth every 6 (six)  hours. 30 tablet 0   . lanolin OINT Apply 1 application topically as needed (for breast care). 56 g 0   . nitrofurantoin, macrocrystal-monohydrate, (MACROBID) 100 MG capsule Take 1 capsule (100 mg total) by mouth at bedtime. 7 capsule 0   . oxyCODONE-acetaminophen (PERCOCET/ROXICET) 5-325 MG per tablet Take 1 tablet by mouth every 4 (four) hours as needed (for pain scale less than 7). 30 tablet 0   . phenazopyridine (PYRIDIUM) 200 MG tablet Take 1 tablet (200 mg total) by mouth 3 (three) times daily with meals. 10 tablet 0   . Prenatal Vit-Fe Fumarate-FA (PRENATAL MULTIVITAMIN) TABS tablet Take 1 tablet by mouth daily at 12 noon.   01/11/2014 at Unknown time  . Vitamin D, Ergocalciferol, (DRISDOL) 50000 UNITS CAPS capsule Take 1 capsule (50,000 Units total) by mouth every 7 (seven) days. 12 capsule 0 01/11/2014 at Unknown time    Review of Systems  Constitutional: Negative for fever and chills.  Respiratory: Negative for cough and shortness of breath.   Cardiovascular: Negative for chest pain and leg swelling.  Gastrointestinal: Positive for abdominal pain. Negative for heartburn, nausea, vomiting and diarrhea.  Genitourinary: Negative for dysuria, urgency, frequency and hematuria.  Neurological:       No headache   Physical Exam   Blood pressure 104/65, pulse 70, temperature 98.4 F (36.9 C), resp. rate 16, unknown if currently breastfeeding.  Physical Exam  Constitutional: She  is oriented to person, place, and time. She appears well-developed and well-nourished.  HENT:  Head: Normocephalic and atraumatic.  Eyes: Conjunctivae and EOM are normal.  Neck: Normal range of motion.  Cardiovascular: Normal rate.   Respiratory: Effort normal. No respiratory distress.  GI: Soft. She exhibits no distension. There is tenderness (appropriately).  Incision clean, dry, intact, very well healed without drainage/induration/erythema whatsoever   Musculoskeletal: Normal range of motion. She exhibits  no edema (bilateral legs symmetrical) or tenderness (leg).  Neurological: She is alert and oriented to person, place, and time.  Skin: Skin is warm and dry. No erythema.    MAU Course  Procedures  MDM  Assessment and Plan  G1P2 s/p pLTCS POD#18 here with various complaints  Abdominal numbness: reassured normalcy.  Left leg pain: legs symmetrical, no erythema or signs of DVT  Back pain: muscle spasm, encouraged to take flexeril  Amit Meloy ROCIO 02/03/2014, 4:23 PM

## 2014-02-15 ENCOUNTER — Encounter: Payer: Self-pay | Admitting: Obstetrics & Gynecology

## 2014-02-24 ENCOUNTER — Ambulatory Visit: Payer: Medicaid Other | Admitting: Obstetrics & Gynecology

## 2014-04-07 ENCOUNTER — Ambulatory Visit: Payer: Medicaid Other | Admitting: Obstetrics & Gynecology

## 2014-04-07 ENCOUNTER — Ambulatory Visit: Payer: Medicaid Other | Admitting: Obstetrics and Gynecology

## 2014-05-19 ENCOUNTER — Ambulatory Visit (INDEPENDENT_AMBULATORY_CARE_PROVIDER_SITE_OTHER): Payer: Self-pay | Admitting: Obstetrics and Gynecology

## 2014-05-19 ENCOUNTER — Encounter: Payer: Self-pay | Admitting: Obstetrics and Gynecology

## 2014-05-19 LAB — POCT PREGNANCY, URINE: Preg Test, Ur: NEGATIVE

## 2014-05-19 NOTE — Progress Notes (Signed)
States has been sad/ crying because her Mother died 3 months ago and her Mother did not get to see the babies.

## 2014-05-19 NOTE — Progress Notes (Signed)
  Subjective:     Jill [name] is a 44 y.o. female who presents for a postpartum visit. She is 4 months postpartum following a low cervical transverse Cesarean section. I have fully reviewed the prenatal and intrapartum course. The delivery was at 34 gestational weeks. Outcome: primary cesarean section, low transverse incision. Anesthesia: spinal. Postpartum course has been uncomplicated. Baby's course has been uncomplicated. Baby is feeding by breast. Bleeding no bleeding. Bowel function is normal. Bladder function is normal. Patient is sexually active. Contraception method is none. Postpartum depression screening: negative.     Review of Systems A comprehensive review of systems was negative.   Objective:    BP 115/70 mmHg  Pulse 83  Temp(Src) 98.3 F (36.8 C)  Wt 109 lb 6.4 oz (49.624 kg)  LMP 05/05/2014  Breastfeeding? No  General:  alert, cooperative and no distress   Breasts:  inspection negative, no nipple discharge or bleeding, no masses or nodularity palpable  Lungs: clear to auscultation bilaterally  Heart:  regular rate and rhythm  Abdomen: soft, non-tender; bowel sounds normal; no masses,  no organomegaly and incision: healed   Vulva:  normal  Vagina: normal vagina, no discharge, exudate, lesion, or erythema  Cervix:  no cervical motion tenderness  Corpus: normal size, contour, position, consistency, mobility, non-tender  Adnexa:  no mass, fullness, tenderness and normal ovaries palpated bilaterally  Rectal Exam: Not performed.        Assessment:     Normal postpartum exam. Pap smear not done at today's visit.   Plan:    1. Contraception: condoms 2. Patient is medically cleared to resume all activities of daily living 3. Follow up in: as needed.

## 2014-05-19 NOTE — Progress Notes (Signed)
Here for postpartum visit. Used Psychologist, clinicalnterpreter Mahin Taremian. C/o pain /cramping left lower abdomen sometimes. Also c./o pain right shoulder / left knee.

## 2014-06-19 ENCOUNTER — Ambulatory Visit: Payer: Self-pay | Attending: Internal Medicine | Admitting: Internal Medicine

## 2014-06-19 ENCOUNTER — Encounter: Payer: Self-pay | Admitting: Internal Medicine

## 2014-06-19 VITALS — BP 112/76 | HR 72 | Temp 98.0°F | Resp 16 | Wt 110.0 lb

## 2014-06-19 DIAGNOSIS — Z862 Personal history of diseases of the blood and blood-forming organs and certain disorders involving the immune mechanism: Secondary | ICD-10-CM | POA: Insufficient documentation

## 2014-06-19 DIAGNOSIS — E559 Vitamin D deficiency, unspecified: Secondary | ICD-10-CM | POA: Insufficient documentation

## 2014-06-19 DIAGNOSIS — R51 Headache: Secondary | ICD-10-CM | POA: Insufficient documentation

## 2014-06-19 DIAGNOSIS — R5383 Other fatigue: Secondary | ICD-10-CM | POA: Insufficient documentation

## 2014-06-19 DIAGNOSIS — R531 Weakness: Secondary | ICD-10-CM | POA: Insufficient documentation

## 2014-06-19 LAB — ANEMIA PANEL
%SAT: 23 % (ref 20–55)
ABS RETIC: 46.7 10*3/uL (ref 19.0–186.0)
FERRITIN: 28 ng/mL (ref 10–291)
IRON: 63 ug/dL (ref 42–145)
RBC.: 3.89 MIL/uL (ref 3.87–5.11)
Retic Ct Pct: 1.2 % (ref 0.4–2.3)
TIBC: 277 ug/dL (ref 250–470)
UIBC: 214 ug/dL (ref 125–400)
Vitamin B-12: 480 pg/mL (ref 211–911)

## 2014-06-19 LAB — CBC WITH DIFFERENTIAL/PLATELET
Basophils Absolute: 0 10*3/uL (ref 0.0–0.1)
Basophils Relative: 0 % (ref 0–1)
Eosinophils Absolute: 0.1 10*3/uL (ref 0.0–0.7)
Eosinophils Relative: 1 % (ref 0–5)
HEMATOCRIT: 36.2 % (ref 36.0–46.0)
HEMOGLOBIN: 12.2 g/dL (ref 12.0–15.0)
LYMPHS ABS: 2.5 10*3/uL (ref 0.7–4.0)
Lymphocytes Relative: 32 % (ref 12–46)
MCH: 31.4 pg (ref 26.0–34.0)
MCHC: 33.7 g/dL (ref 30.0–36.0)
MCV: 93.1 fL (ref 78.0–100.0)
MPV: 10.7 fL (ref 8.6–12.4)
Monocytes Absolute: 0.4 10*3/uL (ref 0.1–1.0)
Monocytes Relative: 5 % (ref 3–12)
NEUTROS ABS: 4.9 10*3/uL (ref 1.7–7.7)
Neutrophils Relative %: 62 % (ref 43–77)
PLATELETS: 215 10*3/uL (ref 150–400)
RBC: 3.89 MIL/uL (ref 3.87–5.11)
RDW: 12.6 % (ref 11.5–15.5)
WBC: 7.9 10*3/uL (ref 4.0–10.5)

## 2014-06-19 LAB — COMPLETE METABOLIC PANEL WITH GFR
ALK PHOS: 43 U/L (ref 39–117)
ALT: 18 U/L (ref 0–35)
AST: 17 U/L (ref 0–37)
Albumin: 4 g/dL (ref 3.5–5.2)
BUN: 11 mg/dL (ref 6–23)
CALCIUM: 9 mg/dL (ref 8.4–10.5)
CHLORIDE: 102 meq/L (ref 96–112)
CO2: 26 mEq/L (ref 19–32)
CREATININE: 0.61 mg/dL (ref 0.50–1.10)
GFR, Est African American: >89 mL/min
GFR, Est Non African American: >89 mL/min
GLUCOSE: 80 mg/dL (ref 70–99)
POTASSIUM: 4.1 meq/L (ref 3.5–5.3)
Sodium: 136 mEq/L (ref 135–145)
Total Bilirubin: 0.3 mg/dL (ref 0.2–1.2)
Total Protein: 6.3 g/dL (ref 6.0–8.3)

## 2014-06-19 LAB — TSH: TSH: 0.768 u[IU]/mL (ref 0.350–4.500)

## 2014-06-19 NOTE — Progress Notes (Signed)
Patient here for follow up Complains of having headaches on and off Feels weak at times with decreased energy Requesting prescription for vitamins

## 2014-06-19 NOTE — Progress Notes (Signed)
MRN: [id number] Name: Jill [name]  Sex: female Age: 44 y.o. DOB: [birth year]  Allergies: Review of patient's allergies indicates no known allergies.  Chief Complaint  Patient presents with  . Follow-up    HPI: Patient is 44 y.o. female who history of anemia, vitamin D deficiency, comes today for followup, she delivered twin babies in October 2015 , she reported to have feeling of tiredness  and occasional headache, patient takes Tylenol which helped her with the symptoms, denies any depressive symptoms, she taking over-the-counter multivitamins. Currently she denies any headache dizziness chest and shortness of breath.  Past Medical History  Diagnosis Date  . Medical history non-contributory   . Polyp of cervix     Past Surgical History  Procedure Laterality Date  . No past surgeries    . Cesarean section multi-gestational N/A 01/12/2014    Procedure: CESAREAN SECTION MULTI-GESTATIONAL;  Surgeon: Catalina Antigua, MD;  Location: [facility];  Service: Obstetrics;  Laterality: N/A;      Medication List       This list is accurate as of: 06/19/14  3:34 PM.  Always use your most recent med list.               acetaminophen 325 MG tablet  Commonly known as:  TYLENOL  Take 650 mg by mouth every 6 (six) hours as needed.        No orders of the defined types were placed in this encounter.    Immunization History  Administered Date(s) Administered  . Influenza,inj,Quad PF,36+ Mos 12/22/2013  . Tdap 11/24/2013    Family History  Problem Relation Age of Onset  . Diabetes Mother   . Heart disease Mother     History  Substance Use Topics  . Smoking status: Never Smoker   . Smokeless tobacco: Never Used  . Alcohol Use: No    Review of Systems   As noted in HPI  Filed Vitals:   06/19/14 1502  BP: 112/76  Pulse: 72  Temp: 98 F (36.7 C)  Resp: 16    Physical Exam  Physical Exam  Constitutional: She is oriented to person, place, and time. No  distress.  Eyes: EOM are normal. Pupils are equal, round, and reactive to light.  Cardiovascular: Normal rate and regular rhythm.   Pulmonary/Chest: Breath sounds normal. No respiratory distress. She has no wheezes. She has no rales.  Neurological: She is alert and oriented to person, place, and time. She has normal reflexes. No cranial nerve deficit.    CBC    Component Value Date/Time   WBC 14.4* 01/13/2014 0520   RBC 3.07* 01/13/2014 0520   HGB 10.3* 01/13/2014 0520   HCT 29.6* 01/13/2014 0520   PLT 148* 01/13/2014 0520   MCV 96.4 01/13/2014 0520   LYMPHSABS 1.4 10/12/2013 1623   MONOABS 0.4 10/12/2013 1623   EOSABS 0.0 10/12/2013 1623   BASOSABS 0.0 10/12/2013 1623    CMP     Component Value Date/Time   NA 137 10/12/2013 1623   K 3.6* 10/12/2013 1623   CL 103 10/12/2013 1623   CO2 23 10/12/2013 1623   GLUCOSE 102* 10/12/2013 1623   BUN 6 10/12/2013 1623   CREATININE 0.50 10/12/2013 1623   CREATININE 0.44* 08/23/2013 1636   CALCIUM 8.6 10/12/2013 1623   PROT 5.7* 10/12/2013 1623   ALBUMIN 2.5* 10/12/2013 1623   AST 15 10/12/2013 1623   ALT 9 10/12/2013 1623   ALKPHOS 64 10/12/2013 1623   BILITOT <0.2*  10/12/2013 1623   GFRNONAA >90 10/12/2013 1623   GFRNONAA >89 08/23/2013 1636   GFRAA >90 10/12/2013 1623   GFRAA >89 08/23/2013 1636    No results found for: CHOL  No components found for: HGA1C  Lab Results  Component Value Date/Time   AST 15 10/12/2013 04:23 PM    Assessment and Plan  Weakness - Plan: ordered baseline blood work CBC with Differential/Platelet, COMPLETE METABOLIC PANEL WITH GFR, TSH, Vit D  25 hydroxy (rtn osteoporosis monitoring), Hemoglobin A1c  Vitamin D deficiency - Plan:  Recheck Vit D  25 hydroxy (rtn osteoporosis monitoring)  History of anemia - Plan: Anemia panel   Health Maintenance : -Vaccinations:  Up-to-date with flu shot  Return in about 3 months (around 09/19/2014), or if symptoms worsen or fail to improve, for  anemia.   This note has been created with Education officer, environmentalDragon speech recognition software and smart phrase technology. Any transcriptional errors are unintentional.    Doris CheadleADVANI, Ilah Boule, MD

## 2014-06-20 LAB — HEMOGLOBIN A1C
Hgb A1c MFr Bld: 5.3 % (ref <5.7)
Mean Plasma Glucose: 105 mg/dL (ref <117)

## 2014-06-20 LAB — VITAMIN D 25 HYDROXY (VIT D DEFICIENCY, FRACTURES): VIT D 25 HYDROXY: 21 ng/mL — AB (ref 30–100)

## 2014-07-11 ENCOUNTER — Telehealth: Payer: Self-pay | Admitting: *Deleted

## 2014-07-11 NOTE — Telephone Encounter (Signed)
Interpreter line used left message with husband for pt to return my call.

## 2014-07-11 NOTE — Telephone Encounter (Signed)
-----   Message from Lestine Mountenise L Juarez, LPN sent at 3/08/65784/01/2015  1:58 PM EDT -----   ----- Message -----    From: Doris Cheadleeepak Advani, MD    Sent: 06/20/2014   9:35 AM      To: Lestine Mountenise L Juarez, LPN  Blood work reviewed, noticed low vitamin D, call patient advise to start ergocalciferol 50,000 units once a week for the duration of  12 weeks. Rest of blood work is normal.

## 2015-07-13 ENCOUNTER — Ambulatory Visit (INDEPENDENT_AMBULATORY_CARE_PROVIDER_SITE_OTHER): Payer: Self-pay

## 2015-07-13 ENCOUNTER — Ambulatory Visit (HOSPITAL_COMMUNITY)
Admission: EM | Admit: 2015-07-13 | Discharge: 2015-07-13 | Disposition: A | Discharge status: Home / Self Care | Payer: Self-pay | Attending: Emergency Medicine | Admitting: Emergency Medicine

## 2015-07-13 ENCOUNTER — Encounter (HOSPITAL_COMMUNITY): Payer: Self-pay

## 2015-07-13 DIAGNOSIS — M5442 Lumbago with sciatica, left side: Secondary | ICD-10-CM

## 2015-07-13 DIAGNOSIS — S46812A Strain of other muscles, fascia and tendons at shoulder and upper arm level, left arm, initial encounter: Secondary | ICD-10-CM

## 2015-07-13 DIAGNOSIS — M5441 Lumbago with sciatica, right side: Secondary | ICD-10-CM

## 2015-07-13 DIAGNOSIS — S161XXA Strain of muscle, fascia and tendon at neck level, initial encounter: Secondary | ICD-10-CM

## 2015-07-13 LAB — POCT PREGNANCY, URINE: PREG TEST UR: NEGATIVE

## 2015-07-13 MED ORDER — HYDROCODONE-ACETAMINOPHEN 5-325 MG PO TABS: 1.0000 | ORAL_TABLET | ORAL | Status: AC | PRN

## 2015-07-13 MED ORDER — MELOXICAM 7.5 MG PO TABS: 7.5000 mg | ORAL_TABLET | Freq: Every day | ORAL | Status: AC | PRN

## 2015-07-13 MED ORDER — PREDNISONE 50 MG PO TABS: ORAL_TABLET | ORAL | Status: AC

## 2015-07-13 MED ORDER — CYCLOBENZAPRINE HCL 5 MG PO TABS: 5.0000 mg | ORAL_TABLET | Freq: Three times a day (TID) | ORAL | Status: AC | PRN
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
## 2015-07-13 NOTE — Discharge Instructions (Signed)
There is no fracture on your x-rays. Your pain is coming from strain of muscles and ligaments in your back and neck. Take prednisone daily for 5 days. Take meloxicam daily as needed for pain. Use Flexeril 3 times a day as needed for pain. Use the hydrocodone every 4-6 hours as needed for severe pain. Do not drive while taking this medicine. You can apply heat to the sore areas. This should gradually improve over the next 2 weeks. Follow-up as needed.

## 2015-07-27 ENCOUNTER — Ambulatory Visit (HOSPITAL_COMMUNITY)
Admission: EM | Admit: 2015-07-27 | Discharge: 2015-07-27 | Disposition: A | Discharge status: Home / Self Care | Payer: No Typology Code available for payment source | Attending: Emergency Medicine | Admitting: Emergency Medicine

## 2015-07-27 ENCOUNTER — Encounter (HOSPITAL_COMMUNITY): Payer: Self-pay | Admitting: Emergency Medicine

## 2015-07-27 DIAGNOSIS — M25569 Pain in unspecified knee: Secondary | ICD-10-CM

## 2015-07-27 DIAGNOSIS — S46912D Strain of unspecified muscle, fascia and tendon at shoulder and upper arm level, left arm, subsequent encounter: Secondary | ICD-10-CM

## 2015-07-27 DIAGNOSIS — S39012D Strain of muscle, fascia and tendon of lower back, subsequent encounter: Secondary | ICD-10-CM

## 2015-07-27 DIAGNOSIS — S46812D Strain of other muscles, fascia and tendons at shoulder and upper arm level, left arm, subsequent encounter: Secondary | ICD-10-CM

## 2015-07-27 MED ORDER — DICLOFENAC SODIUM 1 % TD GEL: 1.0000 "application " | Freq: Four times a day (QID) | TRANSDERMAL | Status: AC

## 2015-07-27 MED ORDER — TRAMADOL-ACETAMINOPHEN 37.5-325 MG PO TABS: 1.0000 | ORAL_TABLET | Freq: Four times a day (QID) | ORAL | Status: AC | PRN

## 2015-07-27 MED ORDER — NAPROXEN 375 MG PO TABS: 375.0000 mg | ORAL_TABLET | Freq: Two times a day (BID) | ORAL | Status: DC
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
## 2015-07-27 NOTE — Discharge Instructions (Signed)
Heat Therapy  Heat therapy can help ease sore, stiff, injured, and tight muscles and joints. Heat relaxes your muscles, which may help ease your pain.   RISKS AND COMPLICATIONS  If you have any of the following conditions, do not use heat therapy unless your health care provider has approved:   Poor circulation.   Healing wounds or scarred skin in the area being treated.   Diabetes, heart disease, or high blood pressure.   Not being able to feel (numbness) the area being treated.   Unusual swelling of the area being treated.   Active infections.   Blood clots.   Cancer.   Inability to communicate pain. This may include young children and people who have problems with their brain function (dementia).   Pregnancy.  Heat therapy should only be used on old, pre-existing, or long-lasting (chronic) injuries. Do not use heat therapy on new injuries unless directed by your health care provider.  HOW TO USE HEAT THERAPY  There are several different kinds of heat therapy, including:   Moist heat pack.   Warm water bath.   Hot water bottle.   Electric heating pad.   Heated gel pack.   Heated wrap.   Electric heating pad.  Use the heat therapy method suggested by your health care provider. Follow your health care provider's instructions on when and how to use heat therapy.  GENERAL HEAT THERAPY RECOMMENDATIONS   Do not sleep while using heat therapy. Only use heat therapy while you are awake.   Your skin may turn pink while using heat therapy. Do not use heat therapy if your skin turns red.   Do not use heat therapy if you have new pain.   High heat or long exposure to heat can cause burns. Be careful when using heat therapy to avoid burning your skin.   Do not use heat therapy on areas of your skin that are already irritated, such as with a rash or sunburn.  SEEK MEDICAL CARE IF:   You have blisters, redness, swelling, or numbness.   You have new pain.   Your pain is worse.  MAKE SURE  YOU:   Understand these instructions.   Will watch your condition.   Will get help right away if you are not doing well or get worse.     This information is not intended to replace advice given to you by your health care provider. Make sure you discuss any questions you have with your health care provider.     Document Released: 06/02/2011 Document Revised: 03/31/2014 Document Reviewed: 05/03/2013  Elsevier Interactive Patient Education 2016 [employer]

## 2015-08-11 ENCOUNTER — Encounter (HOSPITAL_COMMUNITY): Payer: Self-pay | Admitting: Emergency Medicine

## 2015-08-11 ENCOUNTER — Ambulatory Visit (HOSPITAL_COMMUNITY)
Admission: EM | Admit: 2015-08-11 | Discharge: 2015-08-11 | Disposition: A | Discharge status: Home / Self Care | Payer: No Typology Code available for payment source | Attending: Emergency Medicine | Admitting: Emergency Medicine

## 2015-08-11 DIAGNOSIS — S86911A Strain of unspecified muscle(s) and tendon(s) at lower leg level, right leg, initial encounter: Secondary | ICD-10-CM

## 2015-08-11 DIAGNOSIS — S86811A Strain of other muscle(s) and tendon(s) at lower leg level, right leg, initial encounter: Secondary | ICD-10-CM

## 2015-08-11 DIAGNOSIS — S39012D Strain of muscle, fascia and tendon of lower back, subsequent encounter: Secondary | ICD-10-CM

## 2015-08-11 DIAGNOSIS — S46812D Strain of other muscles, fascia and tendons at shoulder and upper arm level, left arm, subsequent encounter: Secondary | ICD-10-CM

## 2015-08-11 MED ORDER — NAPROXEN 375 MG PO TABS: 375.0000 mg | ORAL_TABLET | Freq: Two times a day (BID) | ORAL | Status: AC
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
## 2015-08-11 NOTE — Discharge Instructions (Signed)
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
## 2015-08-28 ENCOUNTER — Inpatient Hospital Stay (HOSPITAL_COMMUNITY)
Admission: AD | Admit: 2015-08-28 | Discharge: 2015-08-28 | Disposition: A | Discharge status: Home / Self Care | Payer: No Typology Code available for payment source | Source: Ambulatory Visit | Attending: Obstetrics and Gynecology | Admitting: Obstetrics and Gynecology

## 2015-08-28 DIAGNOSIS — B3731 Acute candidiasis of vulva and vagina: Secondary | ICD-10-CM

## 2015-08-28 DIAGNOSIS — N760 Acute vaginitis: Secondary | ICD-10-CM | POA: Insufficient documentation

## 2015-08-28 DIAGNOSIS — A499 Bacterial infection, unspecified: Secondary | ICD-10-CM

## 2015-08-28 DIAGNOSIS — B373 Candidiasis of vulva and vagina: Secondary | ICD-10-CM | POA: Insufficient documentation

## 2015-08-28 DIAGNOSIS — B9689 Other specified bacterial agents as the cause of diseases classified elsewhere: Secondary | ICD-10-CM

## 2015-08-28 LAB — WET PREP, GENITAL
Sperm: NONE SEEN
Trich, Wet Prep: NONE SEEN

## 2015-08-28 LAB — URINALYSIS, ROUTINE W REFLEX MICROSCOPIC
BILIRUBIN URINE: NEGATIVE
Glucose, UA: NEGATIVE mg/dL
HGB URINE DIPSTICK: NEGATIVE
Ketones, ur: NEGATIVE mg/dL
Leukocytes, UA: NEGATIVE
NITRITE: NEGATIVE
PROTEIN: NEGATIVE mg/dL
SPECIFIC GRAVITY, URINE: 1.01 (ref 1.005–1.030)
pH: 7 (ref 5.0–8.0)

## 2015-08-28 LAB — POCT PREGNANCY, URINE: PREG TEST UR: NEGATIVE

## 2015-08-28 MED ORDER — FLUCONAZOLE 150 MG PO TABS: 150.0000 mg | ORAL_TABLET | Freq: Once | ORAL | Status: AC

## 2015-08-28 MED ORDER — METRONIDAZOLE 500 MG PO TABS: 500.0000 mg | ORAL_TABLET | Freq: Two times a day (BID) | ORAL | Status: AC

## 2015-08-28 NOTE — MAU Provider Note (Signed)
Chief Complaint: Dysuria   First Provider Initiated Contact with Patient 08/28/15 1833      SUBJECTIVE HPI: Jill [name] is a 45 y.o. G1P0102 who presents to maternity admissions reporting burning and itching with urination x 1 week.  She also reports her menses were due 10 days ago.  She has not tried any treatments and her symptoms are intermittent and worsening.  It is not associated with pain.  She is married, does not report any STD risks.  She denies abdominal pain, vaginal bleeding, h/a, dizziness, n/v, or fever/chills.     HPI  Past Medical History  Diagnosis Date  . Medical history non-contributory   . Polyp of cervix    Past Surgical History  Procedure Laterality Date  . No past surgeries    . Cesarean section multi-gestational N/A 01/12/2014    Procedure: CESAREAN SECTION MULTI-GESTATIONAL;  Surgeon: Catalina Antigua, MD;  Location: [facility];  Service: Obstetrics;  Laterality: N/A;   Social History   Social History  . Marital Status: Married    Spouse Name: N/A  . Number of Children: N/A  . Years of Education: N/A   Occupational History  . Not on file.   Social History Main Topics  . Smoking status: Never Smoker   . Smokeless tobacco: Never Used  . Alcohol Use: No  . Drug Use: No  . Sexual Activity: Not Currently    Birth Control/ Protection: None   Other Topics Concern  . Not on file   Social History Narrative   No current facility-administered medications on file prior to encounter.   Current Outpatient Prescriptions on File Prior to Encounter  Medication Sig Dispense Refill  . acetaminophen (TYLENOL) 325 MG tablet Take 650 mg by mouth every 6 (six) hours as needed.    . cyclobenzaprine (FLEXERIL) 5 MG tablet Take 1 tablet (5 mg total) by mouth 3 (three) times daily as needed for muscle spasms. 30 tablet 0  . diclofenac sodium (VOLTAREN) 1 % GEL Apply 1 application topically 4 (four) times daily. 100 g 0  . HYDROcodone-acetaminophen (NORCO) 5-325 MG  tablet Take 1 tablet by mouth every 4 (four) hours as needed for severe pain. 20 tablet 0  . meloxicam (MOBIC) 7.5 MG tablet Take 1 tablet (7.5 mg total) by mouth daily as needed for pain. 30 tablet 0  . naproxen (NAPROSYN) 375 MG tablet Take 1 tablet (375 mg total) by mouth 2 (two) times daily. 20 tablet 0  . predniSONE (DELTASONE) 50 MG tablet Take 1 pill daily for 5 days. 5 tablet 0  . traMADol-acetaminophen (ULTRACET) 37.5-325 MG tablet Take 1 tablet by mouth every 6 (six) hours as needed. 15 tablet 0   No Known Allergies  ROS:  Review of Systems  Constitutional: Negative for fever, chills and fatigue.  Respiratory: Negative for shortness of breath.   Cardiovascular: Negative for chest pain.  Genitourinary: Positive for dysuria and vaginal discharge. Negative for flank pain, vaginal bleeding, difficulty urinating, vaginal pain and pelvic pain.  Neurological: Negative for dizziness and headaches.  Psychiatric/Behavioral: Negative.      I have reviewed patient's Past Medical Hx, Surgical Hx, Family Hx, Social Hx, medications and allergies.   Physical Exam  Patient Vitals for the past 24 hrs:  BP Temp Temp src Pulse Resp Weight  08/28/15 1532 (!) 89/64 mmHg 98.1 F (36.7 C) Oral 73 18 109 lb (49.442 kg)   Constitutional: Well-developed, well-nourished female in no acute distress.  Cardiovascular: normal rate Respiratory: normal effort  GI: Abd soft, non-tender. Pos BS x 4 MS: Extremities nontender, no edema, normal ROM Neurologic: Alert and oriented x 4.  GU: Neg CVAT.  PELVIC EXAM: Wet prep and GCC collected by RN   LAB RESULTS Results for orders placed or performed during the hospital encounter of 08/28/15 (from the past 24 hour(s))  Urinalysis, Routine w reflex microscopic (not at [facility])     Status: None   Collection Time: 08/28/15  3:37 PM  Result Value Ref Range   Color, Urine YELLOW YELLOW   APPearance CLEAR CLEAR   Specific Gravity, Urine 1.010 1.005 - 1.030   pH  7.0 5.0 - 8.0   Glucose, UA NEGATIVE NEGATIVE mg/dL   Hgb urine dipstick NEGATIVE NEGATIVE   Bilirubin Urine NEGATIVE NEGATIVE   Ketones, ur NEGATIVE NEGATIVE mg/dL   Protein, ur NEGATIVE NEGATIVE mg/dL   Nitrite NEGATIVE NEGATIVE   Leukocytes, UA NEGATIVE NEGATIVE  Pregnancy, urine POC     Status: None   Collection Time: 08/28/15  3:44 PM  Result Value Ref Range   Preg Test, Ur NEGATIVE NEGATIVE  Wet prep, genital     Status: Abnormal   Collection Time: 08/28/15  5:23 PM  Result Value Ref Range   Yeast Wet Prep HPF POC PRESENT (A) NONE SEEN   Trich, Wet Prep NONE SEEN NONE SEEN   Clue Cells Wet Prep HPF POC PRESENT (A) NONE SEEN   WBC, Wet Prep HPF POC MODERATE (A) NONE SEEN   Sperm NONE SEEN        IMAGING No results found.  MAU Management/MDM: Ordered labs and reviewed results.  No signs of UTI.  Wet prep positive for BV and yeast.  Will treat with Diflucan and Flagyl.  Discussed with pt and pt husband.  Pt desires f/u in [facility], so message sent to set up appointment. Pt stable at time of discharge.  ASSESSMENT 1. BV (bacterial vaginosis)   2. Vaginal yeast infection     PLAN Discharge home   Medication List    TAKE these medications        acetaminophen 325 MG tablet  Commonly known as:  TYLENOL  Take 650 mg by mouth every 6 (six) hours as needed.     cyclobenzaprine 5 MG tablet  Commonly known as:  FLEXERIL  Take 1 tablet (5 mg total) by mouth 3 (three) times daily as needed for muscle spasms.     diclofenac sodium 1 % Gel  Commonly known as:  VOLTAREN  Apply 1 application topically 4 (four) times daily.     fluconazole 150 MG tablet  Commonly known as:  DIFLUCAN  Take 1 tablet (150 mg total) by mouth once. Can take additional dose three days later if symptoms persist     HYDROcodone-acetaminophen 5-325 MG tablet  Commonly known as:  NORCO  Take 1 tablet by mouth every 4 (four) hours as needed for severe pain.     meloxicam 7.5 MG tablet  Commonly  known as:  MOBIC  Take 1 tablet (7.5 mg total) by mouth daily as needed for pain.     metroNIDAZOLE 500 MG tablet  Commonly known as:  FLAGYL  Take 1 tablet (500 mg total) by mouth 2 (two) times daily.     naproxen 375 MG tablet  Commonly known as:  NAPROSYN  Take 1 tablet (375 mg total) by mouth 2 (two) times daily.     predniSONE 50 MG tablet  Commonly known as:  DELTASONE  Take 1  pill daily for 5 days.     traMADol-acetaminophen 37.5-325 MG tablet  Commonly known as:  ULTRACET  Take 1 tablet by mouth every 6 (six) hours as needed.           Follow-up Information    [facility]   Specialty:  Obstetrics and Gynecology   Why:  As needed if symptoms worsen   Contact information:   [address] [place] [id number] [phone]      Sharen CounterLisa Leftwich-Kirby Certified Nurse-Midwife 08/28/2015  7:26 PM

## 2015-08-28 NOTE — MAU Note (Addendum)
Pt C/O itching with urination, also burning x 1 week.  Denies abd pain, vomiting or diarrhea. States her period was due 10 days ago.

## 2015-08-28 NOTE — Discharge Instructions (Signed)
Bacterial Vaginosis Bacterial vaginosis is a vaginal infection that occurs when the normal balance of bacteria in the vagina is disrupted. It results from an overgrowth of certain bacteria. This is the most common vaginal infection in women of childbearing age. Treatment is important to prevent complications, especially in pregnant women, as it can cause a premature delivery. CAUSES  Bacterial vaginosis is caused by an increase in harmful bacteria that are normally present in smaller amounts in the vagina. Several different kinds of bacteria can cause bacterial vaginosis. However, the reason that the condition develops is not fully understood. RISK FACTORS Certain activities or behaviors can put you at an increased risk of developing bacterial vaginosis, including:  Having a new sex partner or multiple sex partners.  Douching.  Using an intrauterine device (IUD) for contraception. Women do not get bacterial vaginosis from toilet seats, bedding, swimming pools, or contact with objects around them. SIGNS AND SYMPTOMS  Some women with bacterial vaginosis have no signs or symptoms. Common symptoms include:  Grey vaginal discharge.  A fishlike odor with discharge, especially after sexual intercourse.  Itching or burning of the vagina and vulva.  Burning or pain with urination. DIAGNOSIS  Your health care provider will take a medical history and examine the vagina for signs of bacterial vaginosis. A sample of vaginal fluid may be taken. Your health care provider will look at this sample under a microscope to check for bacteria and abnormal cells. A vaginal pH test may also be done.  TREATMENT  Bacterial vaginosis may be treated with antibiotic medicines. These may be given in the form of a pill or a vaginal cream. A second round of antibiotics may be prescribed if the condition comes back after treatment. Because bacterial vaginosis increases your risk for sexually transmitted diseases, getting  treated can help reduce your risk for chlamydia, gonorrhea, HIV, and herpes. HOME CARE INSTRUCTIONS   Only take over-the-counter or prescription medicines as directed by your health care provider.  If antibiotic medicine was prescribed, take it as directed. Make sure you finish it even if you start to feel better.  Tell all sexual partners that you have a vaginal infection. They should see their health care provider and be treated if they have problems, such as a mild rash or itching.  During treatment, it is important that you follow these instructions:  Avoid sexual activity or use condoms correctly.  Do not douche.  Avoid alcohol as directed by your health care provider.  Avoid breastfeeding as directed by your health care provider. SEEK MEDICAL CARE IF:   Your symptoms are not improving after 3 days of treatment.  You have increased discharge or pain.  You have a fever. MAKE SURE YOU:   Understand these instructions.  Will watch your condition.  Will get help right away if you are not doing well or get worse. FOR MORE INFORMATION  Centers for Disease Control and Prevention, Division of STD Prevention: www.cdc.gov/std American Sexual Health Association (ASHA): www.ashastd.org    This information is not intended to replace advice given to you by your health care provider. Make sure you discuss any questions you have with your health care provider.   Document Released: 03/10/2005 Document Revised: 03/31/2014 Document Reviewed: 10/20/2012 Elsevier Interactive Patient Education 2016 Elsevier Inc. Monilial Vaginitis Vaginitis in a soreness, swelling and redness (inflammation) of the vagina and vulva. Monilial vaginitis is not a sexually transmitted infection. CAUSES  Yeast vaginitis is caused by yeast (candida) that is normally found in   your vagina. With a yeast infection, the candida has overgrown in number to a point that upsets the chemical balance. SYMPTOMS   White,  thick vaginal discharge.  Swelling, itching, redness and irritation of the vagina and possibly the lips of the vagina (vulva).  Burning or painful urination.  Painful intercourse. DIAGNOSIS  Things that may contribute to monilial vaginitis are:  Postmenopausal and virginal states.  Pregnancy.  Infections.  Being tired, sick or stressed, especially if you had monilial vaginitis in the past.  Diabetes. Good control will help lower the chance.  Birth control pills.  Tight fitting garments.  Using bubble bath, feminine sprays, douches or deodorant tampons.  Taking certain medications that kill germs (antibiotics).  Sporadic recurrence can occur if you become ill. TREATMENT  Your caregiver will give you medication.  There are several kinds of anti monilial vaginal creams and suppositories specific for monilial vaginitis. For recurrent yeast infections, use a suppository or cream in the vagina 2 times a week, or as directed.  Anti-monilial or steroid cream for the itching or irritation of the vulva may also be used. Get your caregiver's permission.  Painting the vagina with methylene blue solution may help if the monilial cream does not work.  Eating yogurt may help prevent monilial vaginitis. HOME CARE INSTRUCTIONS   Finish all medication as prescribed.  Do not have sex until treatment is completed or after your caregiver tells you it is okay.  Take warm sitz baths.  Do not douche.  Do not use tampons, especially scented ones.  Wear cotton underwear.  Avoid tight pants and panty hose.  Tell your sexual partner that you have a yeast infection. They should go to their caregiver if they have symptoms such as mild rash or itching.  Your sexual partner should be treated as well if your infection is difficult to eliminate.  Practice safer sex. Use condoms.  Some vaginal medications cause latex condoms to fail. Vaginal medications that harm condoms are:  Cleocin  cream.  Butoconazole (Femstat).  Terconazole (Terazol) vaginal suppository.  Miconazole (Monistat) (may be purchased over the counter). SEEK MEDICAL CARE IF:   You have a temperature by mouth above 102 F (38.9 C).  The infection is getting worse after 2 days of treatment.  The infection is not getting better after 3 days of treatment.  You develop blisters in or around your vagina.  You develop vaginal bleeding, and it is not your menstrual period.  You have pain when you urinate.  You develop intestinal problems.  You have pain with sexual intercourse.   This information is not intended to replace advice given to you by your health care provider. Make sure you discuss any questions you have with your health care provider.   Document Released: 12/18/2004 Document Revised: 06/02/2011 Document Reviewed: 09/11/2014 Elsevier Interactive Patient Education 2016 Elsevier Inc.  

## 2015-08-29 LAB — HIV ANTIBODY (ROUTINE TESTING W REFLEX): HIV Screen 4th Generation wRfx: NONREACTIVE

## 2015-08-29 LAB — GC/CHLAMYDIA PROBE AMP (~~LOC~~) NOT AT ARMC
CHLAMYDIA, DNA PROBE: NEGATIVE
Neisseria Gonorrhea: NEGATIVE

## 2015-09-11 ENCOUNTER — Encounter: Payer: Self-pay | Admitting: Obstetrics and Gynecology

## 2015-10-09 ENCOUNTER — Ambulatory Visit: Payer: Self-pay | Attending: Orthopedic Surgery | Admitting: Physical Therapy

## 2015-10-09 DIAGNOSIS — M5441 Lumbago with sciatica, right side: Secondary | ICD-10-CM | POA: Insufficient documentation

## 2015-10-09 DIAGNOSIS — M6281 Muscle weakness (generalized): Secondary | ICD-10-CM | POA: Insufficient documentation

## 2015-10-09 DIAGNOSIS — M25612 Stiffness of left shoulder, not elsewhere classified: Secondary | ICD-10-CM | POA: Insufficient documentation

## 2015-10-09 DIAGNOSIS — M62830 Muscle spasm of back: Secondary | ICD-10-CM | POA: Insufficient documentation

## 2015-10-09 DIAGNOSIS — R293 Abnormal posture: Secondary | ICD-10-CM | POA: Insufficient documentation

## 2015-10-09 DIAGNOSIS — M25512 Pain in left shoulder: Secondary | ICD-10-CM | POA: Insufficient documentation

## 2015-10-09 NOTE — Patient Instructions (Addendum)
Double Knee to Chest (Flexion)   Gently pull both knees toward chest. Feel stretch in lower back or buttock area. Breathing deeply, Hold ___30_ seconds. Repeat __3__ times. Do _2___ sessions per day.  http://gt2.exer.us/227   Copyright  VHI. All rights reserved.  Piriformis (Supine)   Copyright  VHI. All rights reserved.  Lower Trunk Rotation Stretch   Keeping back flat and feet together, rotate knees to left side. Hold ___30_ seconds. Repeat __3_ times per set. . Do __2__ sessions per day.  Levator Stretch   Grasp seat or sit on hand on side to be stretched. Turn head toward other side and look down. Use hand on head to gently stretch neck in that position. Hold _30___ seconds. Repeat on other side. Repeat _2-3___ times. Do 2-3____ sessions per day.  http://gt2.exer.us/30   Copyright  VHI. All rights reserved.  Side-Bending   One hand on opposite side of head, pull head to side as far as is comfortable. Stop if there is pain. Hold __30__ seconds. Repeat with other hand to other side. Repeat 2-3____ times. Do 3____ sessions per day.   Pt explained in [place] with interpreter. Garen LahLawrie Yoshie Kosel, PT 10/09/2015 8:29 AM Phone: [phone] Fax: [phone]
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
## 2015-10-10 ENCOUNTER — Ambulatory Visit: Payer: No Typology Code available for payment source | Admitting: Physical Therapy

## 2015-10-15 ENCOUNTER — Ambulatory Visit: Payer: Self-pay | Admitting: Obstetrics and Gynecology

## 2015-10-16 ENCOUNTER — Encounter: Payer: Medicaid Other | Admitting: Physical Therapy

## 2015-10-18 ENCOUNTER — Encounter: Payer: Medicaid Other | Admitting: Physical Therapy

## 2015-10-23 ENCOUNTER — Ambulatory Visit: Payer: Self-pay | Attending: Orthopedic Surgery | Admitting: Physical Therapy

## 2015-10-23 DIAGNOSIS — R293 Abnormal posture: Secondary | ICD-10-CM | POA: Insufficient documentation

## 2015-10-23 DIAGNOSIS — M5441 Lumbago with sciatica, right side: Secondary | ICD-10-CM | POA: Insufficient documentation

## 2015-10-23 DIAGNOSIS — M25612 Stiffness of left shoulder, not elsewhere classified: Secondary | ICD-10-CM | POA: Insufficient documentation

## 2015-10-23 DIAGNOSIS — M6281 Muscle weakness (generalized): Secondary | ICD-10-CM | POA: Insufficient documentation

## 2015-10-23 DIAGNOSIS — M25512 Pain in left shoulder: Secondary | ICD-10-CM | POA: Insufficient documentation

## 2015-10-23 DIAGNOSIS — M62830 Muscle spasm of back: Secondary | ICD-10-CM | POA: Insufficient documentation

## 2015-10-23 NOTE — Patient Instructions (Addendum)
Pt with [employer] manager for Farsi to interpret instructions to patient    Copyright  VHI. All rights reserved.   Pelvic Tilt   Flatten back by tightening stomach muscles and buttocks. Repeat __10__ times per set. Hold 5 seconds Do __1__ sets per session. Do _2-3___ sessions per day.  http://orth.exer.us/134   Copyright  VHI. All rights reserved. Knee to Chest (Flexion)   Pull knee toward chest. Feel stretch in lower back or buttock area. Breathing deeply, Hold _10___ seconds. Repeat with other knee. Repeat __5__ times. Do _2-3___ sessions per day.  http://[email or web address].exer.us/225   Double Knee to Chest (Flexion)    Gently pull both knees toward chest. Feel stretch in lower back or buttock area. Breathing deeply, Hold ____ seconds. May use towel to assist Repeat _5___ times.  Hold 10 sec Do _2-3___ sessions per day.  http://[email or web address].exer.us/228   Copyright  VHI. All rights reserved.    Copyright  VHI. All rights reserved.   Lower Trunk Rotation Stretch   Keeping back flat and feet together, rotate knees to left side. Hold _30___ seconds. Repeat __2-3__ times per set. Do _1___ sets per session. Do __2-3__ sessions per day.     Copyright  VHI. All rights reserved.  Standing Arch (Extension)   Place hands in small of back. Using hands as fulcrum, arch backward. Try to keep knees straight. Great exercise if sitting makes pain worse. Use to break up long periods of sitting. Repeat __5__ times. Hold 5 sec. Try to do every hour  http://[email or web address].exer.us/247    Garen Lah, PT 10/23/15 9:24 AM Phone: [phone] Fax: [phone]
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
## 2015-10-25 ENCOUNTER — Ambulatory Visit: Payer: Self-pay | Admitting: Physical Therapy

## 2015-10-25 DIAGNOSIS — M25512 Pain in left shoulder: Secondary | ICD-10-CM

## 2015-10-25 DIAGNOSIS — M25612 Stiffness of left shoulder, not elsewhere classified: Secondary | ICD-10-CM

## 2015-10-25 DIAGNOSIS — R293 Abnormal posture: Secondary | ICD-10-CM

## 2015-10-25 DIAGNOSIS — M5441 Lumbago with sciatica, right side: Secondary | ICD-10-CM

## 2015-10-25 DIAGNOSIS — M6281 Muscle weakness (generalized): Secondary | ICD-10-CM

## 2015-10-25 DIAGNOSIS — M62830 Muscle spasm of back: Secondary | ICD-10-CM

## 2015-10-25 NOTE — Therapy (Signed)
[facility] [address] [place], [place], [address] Phone: [phone]   Fax:  [phone]  Physical Therapy Treatment  Patient Details  Name: Jill [name] MRN: [id number] Date of Birth: January 16, 1971 Referring Provider: Beverely Low, MD  Encounter Date: 10/25/2015      PT End of Session - 10/25/15 0812    Visit Number 3   Number of Visits 12   Date for PT Re-Evaluation 11/20/15   Authorization Type Medicaid / Self-pay   PT Start Time 0801   PT Stop Time 0855   PT Time Calculation (min) 54 min   Activity Tolerance Patient tolerated treatment well;Patient limited by pain   Behavior During Therapy [facility] for tasks assessed/performed      Past Medical History:  Diagnosis Date  . Medical history non-contributory   . Polyp of cervix     Past Surgical History:  Procedure Laterality Date  . CESAREAN SECTION MULTI-GESTATIONAL N/A 01/12/2014   Procedure: CESAREAN SECTION MULTI-GESTATIONAL;  Surgeon: Catalina Antigua, MD;  Location: [facility];  Service: Obstetrics;  Laterality: N/A;  . NO PAST SURGERIES      There were no vitals filed for this visit.      Subjective Assessment - 10/25/15 0813    Subjective I am doing worse in back  my neck is a little better  I am a 9/10 in back and 7/10 in neck.  After treatment  6/10 in both.  She is walking 30 minutes a day. Pt states she has not done any exercises due to pain   Patient is accompained by: Interpreter   Limitations House hold activities;Sitting;Standing;Walking   How long can you walk comfortably? 30 minutes    Pain Score 7    Pain Location Neck   Pain Orientation Right;Left  left > R   Pain Descriptors / Indicators Aching;Tightness   Pain Type Chronic pain   Pain Onset More than a month ago   Pain Frequency Constant   Pain Score 7   Pain Location Shoulder   Pain Orientation Left   Pain Descriptors / Indicators Aching;Sharp   Pain Onset More than a month ago   Pain  Frequency Constant   Pain Score 9   Pain Location Back   Pain Orientation Right;Left   Pain Descriptors / Indicators Aching;Spasm   Pain Type Chronic pain            [facility] PT Assessment - 10/25/15 0817      AROM   Left Shoulder Flexion 119 Degrees  marked pain   Cervical Flexion 60   Cervical Extension 40   Cervical - Right Side Bend 40   Cervical - Left Side Bend 25   Cervical - Right Rotation 40   Cervical - Left Rotation 35                     [facility] Adult PT Treatment/Exercise - 10/25/15 0814      Posture/Postural Control   Posture/Postural Control Postural limitations   Posture Comments left upper trap elevated     Self-Care   Self-Care Other Self-Care Comments   Other Self-Care Comments  trigger point dry needling education and after care / precautians     Knee/Hip Exercises: Aerobic   Nustep L 1 dmin with UE very slowly 5 min     Modalities   Modalities Moist Heat     Moist Heat Therapy   Number Minutes Moist Heat 15 Minutes   Moist Heat Location  Cervical;Lumbar Spine     Ultrasound   Ultrasound Location left upper trap   Ultrasound Parameters continuous 100% on left upper trap   Ultrasound Goals Pain     Manual Therapy   Manual Therapy Soft tissue mobilization   Soft tissue mobilization gentle soft tissue using IASTYM to left upper trap     Neck Exercises: Stretches   Upper Trapezius Stretch 3 reps;30 seconds  left only   Upper Trapezius Stretch Limitations pain and self limiting AROM   Levator Stretch 3 reps;30 seconds  left   Levator Stretch Limitations self limiting AROM   Neck Stretch 5 reps;10 seconds   Neck Stretch Limitations neck retraction    Other Neck Stretches prone diagonals of neck in prone on elbow 10 x          Trigger Point Dry Needling - 10/25/15 0815    Consent Given? Yes   Education Handout Provided Yes  interpreter reads in Farsi   Muscles Treated Upper Body Upper trapezius  left side only, tolerated  only one stick   Upper Trapezius Response Twitch reponse elicited  on; t   Levator Scapulae Response --              PT Education - 10/25/15 0843    Education provided Yes   Education Details Trigger point dry needling education, aftercare and precautians,  Neck tension  levator and upper trap and neck retraction   Person(s) Educated Patient;Other (comment)  interpreter          PT Short Term Goals - 10/25/15 0907      PT SHORT TERM GOAL #1   Title "Independent with initial HEP   Baseline no knowledge of HEP   Time 3   Period Weeks   Status On-going     PT SHORT TERM GOAL #2   Title "Report decreased pain by 25%   Baseline Pt back 9/10 and neck 7/10 with spasms   Time 3   Period Weeks   Status On-going     PT SHORT TERM GOAL #3   Title "Demonstrate understanding of proper sitting posture, body mechanics, work ergonomics, and be more conscious of position and posture throughout the day.    Time 3   Period Weeks   Status On-going           PT Long Term Goals - 10/25/15 0908      PT LONG TERM GOAL #1   Title "Demonstrate and verbalize techniques to reduce the risk of re-injury including: lifting, posture, body mechanics.    Baseline no knowledge   Time 6   Period Weeks   Status On-going     PT LONG TERM GOAL #2   Title "Pt will be independent with advanced HEP   Baseline no knowledge   Time 6   Period Weeks   Status On-going     PT LONG TERM GOAL #3   Title "Pain will decrease to 2/10 or less  with all functional activities/ household activities   Baseline back 9/10 and neck 7/10   Period Weeks   Status On-going     PT LONG TERM GOAL #4   Title Pt will be able to stand for  1 hour using pain management strategies and posture helps in order to complete household tasks and care for twins   Baseline unable to stand/walk for greater than   Time 6   Period Weeks   Status On-going     PT LONG  TERM GOAL #5   Title Pt will be able to sleep at  night at least 3-4 hours interrupted and  turn at night without awakening from sleep due to pain   Baseline difficulty with sleep at night, unable to sleep more than 1 hour at a time   Time 6   Period Weeks   Status On-going               Plan - 10/25/15 1610    Clinical Impression Statement Mrs. Sharifit  enters clinic stating she is doing worse than before in back with 9/10 pain bilaterally  but a little better in left neck.  Pt shows improved AROM in left shoulder to flex to 119 gfrom 90  and AROM cervical of flexion 60 ext 40 right SB 40 L SB 25 right rotation 40, Left rot 35.  All improved from evaluation.  Pt is anxious an tentative about any movement only moving 1/2 ranges for exericises but able to perform more movement .  Pt consented to trigger point dry needling treatment and was closely monitiored but only could tolerate one stick of needle.  Pt benefitted from [place], E stim and soft tisse for neck and added to HEP.   Pain was reduced to 6/10 in neck and back and AROM of neck greatly improved.  Pt encourged through interpreter to conitnue movement and walking.   Rehab Potential Good   PT Frequency 2x / week   PT Duration 6 weeks   PT Treatment/Interventions ADLs/Self Care Home Management;Cryotherapy;Electrical Stimulation;Moist Heat;Iontophoresis 4mg /ml Dexamethasone;Ultrasound;Therapeutic exercise;Stair training;Gait training;Neuromuscular re-education;Patient/family education;Manual techniques;Passive range of motion;Dry needling;Taping   PT Next Visit Plan Pt would benefit from scapular strength, basic back HEP, try to move away from dependence on modalities and do exericise   PT Home Exercise Plan upper trap and levator and neck retraction added to HEP   Consulted and Agree with Plan of Care Patient      Patient will benefit from skilled therapeutic intervention in order to improve the following deficits and impairments:  Increased muscle spasms, Decreased activity tolerance,  Decreased range of motion, Decreased strength, Pain, Improper body mechanics, Postural dysfunction  Visit Diagnosis: Pain in left shoulder  Low back pain with right-sided sciatica, unspecified back pain laterality  Muscle weakness (generalized)  Stiffness of left shoulder, not elsewhere classified  Abnormal posture  Muscle spasm of back     Problem List Patient Active Problem List   Diagnosis Date Noted  . Urinary retention with incomplete bladder emptying after cesarean section 01/16/2014  . S/P cesarean section 01/13/2014  . Poor fetal growth complicating pregnancy in second trimester, antepartum 11/03/2013  . Language barrier, cultural differences 09/21/2013  . Advanced maternal age, antepartum condition or complication 07/26/2013  . 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 Polyp of cervix affecting pregnancy in first trimester 07/16/2013  . Monochorionic diamniotic twin pregnancy in first trimester - discordant growth 07/16/2013    Garen Lah, PT 10/25/15 9:29 AM Phone: [phone] Fax: [phone]  [facility] [address] [place], [place], [address] Phone: [phone]   Fax:  [phone]  Name: Dalonda Simoni MRN: [id number] Date of Birth: 1970-08-10

## 2015-10-25 NOTE — Patient Instructions (Addendum)
Read in Farsi by Interpreter  Trigger Point Dry Needling  . What is Trigger Point Dry Needling (DN)? o DN is a physical therapy technique used to treat muscle pain and dysfunction. Specifically, DN helps deactivate muscle trigger points (muscle knots).  o A thin filiform needle is used to penetrate the skin and stimulate the underlying trigger point. The goal is for a local twitch response (LTR) to occur and for the trigger point to relax. No medication of any kind is injected during the procedure.   . What Does Trigger Point Dry Needling Feel Like?  o The procedure feels different for each individual patient. Some patients report that they do not actually feel the needle enter the skin and overall the process is not painful. Very mild bleeding may occur. However, many patients feel a deep cramping in the muscle in which the needle was inserted. This is the local twitch response.   Marland Kitchen How Will I feel after the treatment? o Soreness is normal, and the onset of soreness may not occur for a few hours. Typically this soreness does not last longer than two days.  o Bruising is uncommon, however; ice can be used to decrease any possible bruising.  o In rare cases feeling tired or nauseous after the treatment is normal. In addition, your symptoms may get worse before they get better, this period will typically not last longer than 24 hours.   . What Can I do After My Treatment? o Increase your hydration by drinking more water for the next 24 hours. o You may place ice or heat on the areas treated that have become sore, however, do not use heat on inflamed or bruised areas. Heat often brings more relief post needling. o You can continue your regular activities, but vigorous activity is not recommended initially after the treatment for 24 hours. o DN is best combined with other physical therapy such as strengthening, stretching, and other therapies.   Levator Stretch            Garen Lah, PT 10/25/15 8:14 AM Phone: [phone] Fax: [phone]

## 2015-10-30 ENCOUNTER — Ambulatory Visit: Payer: Self-pay | Admitting: Physical Therapy

## 2015-10-30 DIAGNOSIS — M62830 Muscle spasm of back: Secondary | ICD-10-CM

## 2015-10-30 DIAGNOSIS — M5441 Lumbago with sciatica, right side: Secondary | ICD-10-CM

## 2015-10-30 DIAGNOSIS — R293 Abnormal posture: Secondary | ICD-10-CM

## 2015-10-30 DIAGNOSIS — M6281 Muscle weakness (generalized): Secondary | ICD-10-CM

## 2015-10-30 DIAGNOSIS — M25612 Stiffness of left shoulder, not elsewhere classified: Secondary | ICD-10-CM

## 2015-10-30 DIAGNOSIS — M25512 Pain in left shoulder: Secondary | ICD-10-CM

## 2015-10-30 NOTE — Therapy (Signed)
[facility] [address] [place], [place], [address] Phone: [phone]   Fax:  [phone]  Physical Therapy Treatment  Patient Details  Name: Jill [name] MRN: [id number] Date of Birth: 01/11/1971 Referring Provider: Beverely Low, MD  Encounter Date: 10/30/2015      PT End of Session - 10/30/15 1415    Visit Number 4   Number of Visits 12   Date for PT Re-Evaluation 11/20/15   PT Start Time 0935   PT Stop Time 1040   PT Time Calculation (min) 65 min   Activity Tolerance Patient tolerated treatment well   Behavior During Therapy [facility] for tasks assessed/performed      Past Medical History:  Diagnosis Date  . Medical history non-contributory   . Polyp of cervix     Past Surgical History:  Procedure Laterality Date  . CESAREAN SECTION MULTI-GESTATIONAL N/A 01/12/2014   Procedure: CESAREAN SECTION MULTI-GESTATIONAL;  Surgeon: Catalina Antigua, MD;  Location: [facility];  Service: Obstetrics;  Laterality: N/A;  . NO PAST SURGERIES      There were no vitals filed for this visit.      Subjective Assessment - 10/30/15 1812    Pain Descriptors / Indicators Aching   Pain Frequency Intermittent                         [facility] Adult PT Treatment/Exercise - 10/30/15 0001      Self-Care   Other Self-Care Comments  shoulder anatomy, and how it reklates to posture/pain.  Model/skeleton used.      Lumbar Exercises: Stretches   Single Knee to Chest Stretch 5 reps  cues, HEP   Lower Trunk Rotation 5 reps  5 seconds,  slow, limited motions, HEP   Pelvic Tilt 5 reps  HEP   Pelvic Tilt Limitations cues   Piriformis Stretch 2 reps;10 seconds  HEP     Lumbar Exercises: Supine   Bridge 5 reps  HEP, painful with lift, eases with rest, 2 sets     Shoulder Exercises: Seated   External Rotation 5 reps  2 sets. yellow band, cues, issued for home No written HEP     Moist Heat Therapy   Number Minutes Moist Heat  15 Minutes   Moist Heat Location Cervical;Shoulder;Lumbar Spine     Neck Exercises: Stretches   Upper Trapezius Stretch 2 reps;20 seconds  cues technique   Levator Stretch 1 rep;20 seconds  each, cues   Other Neck Stretches scapular retraction, 5 X 5 seconds, cues to lower shoulder.                PT Education - 10/30/15 1415    Education provided Yes   Education Details exercise   Person(s) Educated Patient   Methods Explanation;Demonstration;Tactile cues;Verbal cues;Handout   Comprehension Verbalized understanding;Returned demonstration;Need further instruction          PT Short Term Goals - 10/30/15 1819      PT SHORT TERM GOAL #1   Title "Independent with initial HEP   Baseline needs cues   Time 3   Period Weeks   Status On-going     PT SHORT TERM GOAL #2   Title "Report decreased pain by 25%   Time 3   Period Weeks   Status On-going     PT SHORT TERM GOAL #3   Title "Demonstrate understanding of proper sitting posture, body mechanics, work ergonomics, and be more conscious of position and posture  throughout the day.    Baseline beginning ed   Time 3   Period Weeks   Status On-going           PT Long Term Goals - 10/25/15 0908      PT LONG TERM GOAL #1   Title "Demonstrate and verbalize techniques to reduce the risk of re-injury including: lifting, posture, body mechanics.    Baseline no knowledge   Time 6   Period Weeks   Status On-going     PT LONG TERM GOAL #2   Title "Pt will be independent with advanced HEP   Baseline no knowledge   Time 6   Period Weeks   Status On-going     PT LONG TERM GOAL #3   Title "Pain will decrease to 2/10 or less  with all functional activities/ household activities   Baseline back 9/10 and neck 7/10   Period Weeks   Status On-going     PT LONG TERM GOAL #4   Title Pt will be able to stand for  1 hour using pain management strategies and posture helps in order to complete household tasks and care for  twins   Baseline unable to stand/walk for greater than 10min   Time 6   Period Weeks   Status On-going     PT LONG TERM GOAL #5   Title Pt will be able to sleep at night at least 3-4 hours interrupted and  turn at night without awakening from sleep due to pain   Baseline difficulty with sleep at night, unable to sleep more than 1 hour at a time   Time 6   Period Weeks   Status On-going               Plan - 10/30/15 1816    Clinical Impression Statement Able to progress home exercises.  Exercise is painful  but does ease between reptitions. Education with skeleton and models helpful to explain posture and why we do certain exercises.  Pain about the same post session prior to moist heat.    PT Next Visit Plan review lumbar exercises,  consider supine scapular stabilization exercises, if not UE ranger with scapular guide?   PT Home Exercise Plan basic back   Consulted and Agree with Plan of Care Patient      Patient will benefit from skilled therapeutic intervention in order to improve the following deficits and impairments:  Increased muscle spasms, Decreased activity tolerance, Decreased range of motion, Decreased strength, Pain, Improper body mechanics, Postural dysfunction  Visit Diagnosis: Pain in left shoulder  Low back pain with right-sided sciatica, unspecified back pain laterality  Muscle weakness (generalized)  Stiffness of left shoulder, not elsewhere classified  Abnormal posture  Muscle spasm of back     Problem List Patient Active Problem List   Diagnosis Date Noted  . Urinary retention with incomplete bladder emptying after cesarean section 01/16/2014  . S/P cesarean section 01/13/2014  . Poor fetal growth complicating pregnancy in second trimester, antepartum 11/03/2013  . Language barrier, cultural differences 09/21/2013  . Advanced maternal age, antepartum condition or complication 07/26/2013  . 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 Polyp of cervix affecting  pregnancy in first trimester 07/16/2013  . Monochorionic diamniotic twin pregnancy in first trimester - discordant growth 07/16/2013    [facility] HospitalARRIS,Ronzell Laban 10/30/2015, 6:21 PM  [facility] [address] [place], [place], [address] Phone: [phone]   Fax:  [phone]  Name: West PughKhujista Scheerer MRN: [id number]  Date of Birth: 08-20-1970   Liz Beach, PTA 10/30/15 6:21 PM Phone: [phone] Fax: [phone]

## 2015-10-30 NOTE — Patient Instructions (Addendum)
Shoulder Retraction / External Rotation With Band   5 -10 XSingle Knee to Chest          Basic back from ex drawer.  5-10 x each daily.  Pelvic tilt, single knee to chest, bridge, lower trunk rotation, piriformis stretch Shoulder ER yellow band issued no written HEP 5 X each several times a day for posture

## 2015-11-01 ENCOUNTER — Encounter: Payer: Medicaid Other | Admitting: Physical Therapy

## 2015-11-06 ENCOUNTER — Ambulatory Visit: Payer: Self-pay | Admitting: Physical Therapy

## 2015-11-06 DIAGNOSIS — M25512 Pain in left shoulder: Secondary | ICD-10-CM

## 2015-11-06 DIAGNOSIS — M6281 Muscle weakness (generalized): Secondary | ICD-10-CM

## 2015-11-06 DIAGNOSIS — R293 Abnormal posture: Secondary | ICD-10-CM

## 2015-11-06 DIAGNOSIS — M25612 Stiffness of left shoulder, not elsewhere classified: Secondary | ICD-10-CM

## 2015-11-06 DIAGNOSIS — M62830 Muscle spasm of back: Secondary | ICD-10-CM

## 2015-11-06 DIAGNOSIS — M5441 Lumbago with sciatica, right side: Secondary | ICD-10-CM

## 2015-11-06 NOTE — Therapy (Signed)
[facility] [address] [place], [place], [address] Phone: [phone]   Fax:  [phone]  Physical Therapy Treatment  Patient Details  Name: Jill [name] MRN: [id number] Date of Birth: 04/29/1970 Referring Provider: Beverely LowNorris, Steve, MD  Encounter Date: 11/06/2015      PT End of Session - 11/06/15 1837    Visit Number 5   Number of Visits 12   Date for PT Re-Evaluation 11/20/15   PT Start Time 0850   PT Stop Time 0950   PT Time Calculation (min) 60 min   Activity Tolerance Patient limited by pain   Behavior During Therapy [facility] for tasks assessed/performed      Past Medical History:  Diagnosis Date  . Medical history non-contributory   . Polyp of cervix     Past Surgical History:  Procedure Laterality Date  . CESAREAN SECTION MULTI-GESTATIONAL N/A 01/12/2014   Procedure: CESAREAN SECTION MULTI-GESTATIONAL;  Surgeon: Catalina AntiguaPeggy Constant, MD;  Location: [facility];  Service: Obstetrics;  Laterality: N/A;  . NO PAST SURGERIES      There were no vitals filed for this visit.      Subjective Assessment - 11/06/15 0854    Subjective Exercises are getting done.  Pt has not realy helped yet.  Right knee feels better.   Currently in Pain? Yes   Pain Score 7    Pain Location Neck  Upper trap   Pain Orientation Left   Pain Descriptors / Indicators Aching;Tightness   Pain Radiating Towards Left elbow   Aggravating Factors  worse at night   Pain Relieving Factors medication, massage   Pain Score 7   Pain Location Back   Pain Orientation Lower;Right;Left;Posterior   Pain Descriptors / Indicators Sore   Pain Frequency Intermittent   Aggravating Factors  worse at night,  sleeping on her back   Pain Relieving Factors massage    Pain Orientation Left   Pain Descriptors / Indicators Throbbing                         [facility] Adult PT Treatment/Exercise - 11/06/15 0001      Shoulder Exercises: Supine   Other  Supine Exercises serratus punch AA X5 each,  cane IR/ER painful , chest press painful ,  head press into towel roll painful     Shoulder Exercises: Seated   Retraction 5 reps  5 seconds   Other Seated Exercises shoulder shrugs with 5 second holds added to home program  theses are sore and painfiul.  shoulder circles also tried with pain so patient stops.     Shoulder Exercises: Prone   Other Prone Exercises tried T 3x painful,  tried shoulder extension with guided retraction painful so stopped     Moist Heat Therapy   Number Minutes Moist Heat 15 Minutes   Moist Heat Location Cervical;Shoulder;Lumbar Spine  prone     Manual Therapy   Manual Therapy Soft tissue mobilization   Soft tissue mobilization gentle to neck, upper and lower back.  light presssure, attempted trigger point release,  ,  strumming to lengthen. relax,  even though this was painful patient noted her pain was a little less.      Neck Exercises: Stretches   Upper Trapezius Stretch 1 rep   Levator Stretch 1 rep;10 seconds                PT Education - 11/06/15 1837    Education provided Yes  Education Details exercise   Person(s) Educated Patient;Spouse   Methods Explanation;Demonstration;Tactile cues;Verbal cues;Handout   Comprehension Verbalized understanding;Returned demonstration;Need further instruction          PT Short Term Goals - 11/06/15 1840      PT SHORT TERM GOAL #1   Title "Independent with initial HEP   Baseline needs cues   Time 3   Period Weeks   Status On-going     PT SHORT TERM GOAL #2   Title "Report decreased pain by 25%   Baseline improved knee,  neck/back unchanged   Time 3   Period Weeks   Status On-going     PT SHORT TERM GOAL #3   Title "Demonstrate understanding of proper sitting posture, body mechanics, work ergonomics, and be more conscious of position and posture throughout the day.    Baseline beginning ed   Time 3   Period Weeks   Status On-going            PT Long Term Goals - 10/25/15 0908      PT LONG TERM GOAL #1   Title "Demonstrate and verbalize techniques to reduce the risk of re-injury including: lifting, posture, body mechanics.    Baseline no knowledge   Time 6   Period Weeks   Status On-going     PT LONG TERM GOAL #2   Title "Pt will be independent with advanced HEP   Baseline no knowledge   Time 6   Period Weeks   Status On-going     PT LONG TERM GOAL #3   Title "Pain will decrease to 2/10 or less  with all functional activities/ household activities   Baseline back 9/10 and neck 7/10   Period Weeks   Status On-going     PT LONG TERM GOAL #4   Title Pt will be able to stand for  1 hour using pain management strategies and posture helps in order to complete household tasks and care for twins   Baseline unable to stand/walk for greater than 10min   Time 6   Period Weeks   Status On-going     PT LONG TERM GOAL #5   Title Pt will be able to sleep at night at least 3-4 hours interrupted and  turn at night without awakening from sleep due to pain   Baseline difficulty with sleep at night, unable to sleep more than 1 hour at a time   Time 6   Period Weeks   Status On-going               Plan - 11/06/15 1838    Clinical Impression Statement Low pain tolerance today,  most exercises were not tolerated.  Less pain noted post session,  A few gentle exercises addto her home progam  her husband helped interpert today.   PT Next Visit Plan try nustep or pulleys,  try to get her to move more,  stretch over ball?   PT Home Exercise Plan shoulder shrugs,  ER with band   Consulted and Agree with Plan of Care Patient;Family member/caregiver   Family Member Consulted Husband      Patient will benefit from skilled therapeutic intervention in order to improve the following deficits and impairments:     Visit Diagnosis: Pain in left shoulder  Low back pain with right-sided sciatica, unspecified back pain  laterality  Muscle weakness (generalized)  Stiffness of left shoulder, not elsewhere classified  Abnormal posture  Muscle spasm of back  Problem List Patient Active Problem List   Diagnosis Date Noted  . Urinary retention with incomplete bladder emptying after cesarean section 01/16/2014  . S/P cesarean section 01/13/2014  . Poor fetal growth complicating pregnancy in second trimester, antepartum 11/03/2013  . Language barrier, cultural differences 09/21/2013  . Advanced maternal age, antepartum condition or complication 07/26/2013  . 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 Polyp of cervix affecting pregnancy in first trimester 07/16/2013  . Monochorionic diamniotic twin pregnancy in first trimester - discordant growth 07/16/2013    [facility] 11/06/2015, 6:42 PM  [facility] [address] [place], [place], [address] Phone: [phone]   Fax:  [phone]  Name: Jill [name] MRN: [id number] Date of Birth: Sep 26, 1970   Liz Beach, PTA 11/06/15 6:42 PM Phone: [phone] Fax: [phone]

## 2015-11-06 NOTE — Patient Instructions (Signed)
From exercise drawer,  Scapular retraction, shoulder shrugs up/down 5 second holds,  5 x each work on several times a day.

## 2015-11-08 ENCOUNTER — Ambulatory Visit: Payer: Self-pay | Admitting: Physical Therapy

## 2015-11-08 DIAGNOSIS — M6281 Muscle weakness (generalized): Secondary | ICD-10-CM

## 2015-11-08 DIAGNOSIS — M5441 Lumbago with sciatica, right side: Secondary | ICD-10-CM

## 2015-11-08 DIAGNOSIS — M25512 Pain in left shoulder: Secondary | ICD-10-CM

## 2015-11-08 DIAGNOSIS — M25612 Stiffness of left shoulder, not elsewhere classified: Secondary | ICD-10-CM

## 2015-11-08 DIAGNOSIS — M62830 Muscle spasm of back: Secondary | ICD-10-CM

## 2015-11-08 DIAGNOSIS — R293 Abnormal posture: Secondary | ICD-10-CM

## 2015-11-08 NOTE — Patient Instructions (Signed)
.  bridgingBridging    Slowly raise buttocks from floor, keeping stomach tight. Repeat _10___ times per set. Do _2___ sets per session. Do __1-2__ sessions per day.  Garen LahLawrie Beardsley, PT 11/08/15 9:11 AM Phone: [phone] Fax: [phone]  Over Head Pull: Narrow Grip       On back, knees bent, feet flat, band across thighs, elbows straight but relaxed. Pull hands apart (start). Keeping elbows straight, bring arms up and over head, hands toward floor. Keep pull steady on band. Hold momentarily. Return slowly, keeping pull steady, back to start. Repeat 10___ times. Band color ___red___   Side Pull: Double Arm   On back, knees bent, feet flat. Arms perpendicular to body, shoulder level, elbows straight but relaxed. Pull arms out to sides, elbows straight. Resistance band comes across collarbones, hands toward floor. Hold momentarily. Slowly return to starting position. Repeat 10___ times. Band color _red____   Elisabeth CaraSash   On back, knees bent, feet flat, left hand on left hip, right hand above left. Pull right arm DIAGONALLY (hip to shoulder) across chest. Bring right arm along head toward floor. Hold momentarily. Slowly return to starting position. Repeat _10__ times. Do with left arm. Band color __red____   Shoulder Rotation: Double Arm   On back, knees bent, feet flat, elbows tucked at sides, bent 90, hands palms up. Pull hands apart and down toward floor, keeping elbows near sides. Hold momentarily. Slowly return to starting position. Repeat _10__ times. Band color __red____    http://orth.exer.us/1097   Copyright  VHI. All rights reserved.   Garen LahLawrie Beardsley, PT 11/08/15 9:13 AM Phone: [phone] Fax: [phone]

## 2015-11-08 NOTE — Therapy (Signed)
[facility] [address] [place], [place], [address] Phone: [phone]   Fax:  [phone]  Physical Therapy Treatment  Patient Details  Name: Jill PughKhujista Caruth MRN: [id number] Date of Birth: 10/15/1970 Referring Provider: Beverely LowNorris, Steve, MD  Encounter Date: 11/08/2015      PT End of Session - 11/08/15 0847    Visit Number 6   Number of Visits 12   Date for PT Re-Evaluation 11/20/15   Authorization Type Medicaid / Self-pay   PT Start Time 0847   PT Stop Time 0946   PT Time Calculation (min) 59 min   Activity Tolerance Patient limited by pain   Behavior During Therapy [facility] for tasks assessed/performed      Past Medical History:  Diagnosis Date  . Medical history non-contributory   . Polyp of cervix     Past Surgical History:  Procedure Laterality Date  . CESAREAN SECTION MULTI-GESTATIONAL N/A 01/12/2014   Procedure: CESAREAN SECTION MULTI-GESTATIONAL;  Surgeon: Catalina AntiguaPeggy Constant, MD;  Location: [facility];  Service: Obstetrics;  Laterality: N/A;  . NO PAST SURGERIES      There were no vitals filed for this visit.      Subjective Assessment - 11/08/15 0851    Subjective Last time I had another therapist and I was worried I would not see you again.  I feel good after the massage but I am sore later. I do my exericises every day sometimes 2 .   Limitations House hold activities;Sitting;Standing;Walking   How long can you sit comfortably? 20-30 minutes   How long can you stand comfortably? 30 min   How long can you walk comfortably? 30 min   Diagnostic tests x ray   Patient Stated Goals Relieve pain and hold my baby and turning my neck   Currently in Pain? Yes   Pain Score 6    Pain Location Neck   Pain Orientation Left   Pain Descriptors / Indicators Aching;Tightness   Pain Type Chronic pain   Pain Onset More than a month ago   Pain Frequency Intermittent   Pain Score 7   Pain Location Back   Pain Orientation  Lower;Right;Left;Posterior   Pain Descriptors / Indicators Sore   Pain Type Chronic pain   Pain Onset More than a month ago   Pain Frequency Intermittent   Pain Relieving Factors massage   Pain Score 6   Pain Location Shoulder   Pain Orientation Left   Pain Descriptors / Indicators Throbbing   Pain Type Chronic pain   Pain Onset More than a month ago   Pain Frequency Constant            [facility] PT Assessment - 11/08/15 0001      AROM   Right Shoulder Flexion 130 Degrees   Right Shoulder ABduction 130 Degrees   Right Shoulder Internal Rotation 50 Degrees   Right Shoulder External Rotation 80 Degrees   Left Shoulder Flexion 130 Degrees   Left Shoulder ABduction 126 Degrees                     [facility] Adult PT Treatment/Exercise - 11/08/15 0901      Lumbar Exercises: Supine   Bridge 10 reps  x 2     Knee/Hip Exercises: Aerobic   Nustep L5 for 5 minutes with UE/LE at 9     Shoulder Exercises: Supine   Other Supine Exercises serratus punch x 10 with cane, IR/ER   Other Supine Exercises  supine scapular stabilizers flexion, horizontal abd and diagonals x10 each with red t band     Shoulder Exercises: Standing   Other Standing Exercises using green physio ball for forward flexion and horizontal add and abduction bil x 3 minutes     Moist Heat Therapy   Number Minutes Moist Heat 15 Minutes   Moist Heat Location Cervical;Shoulder;Lumbar Spine  prone     Manual Therapy   Manual Therapy Soft tissue mobilization   Soft tissue mobilization gentle to upper back/neck paraspinals/ left UT and scalenes,  contract relax 10 second hold x 5 on right                PT Education - 11/08/15 0913    Education provided Yes   Education Details bridging and supine scapular stabilizers with red t band.   Person(s) Educated Patient;Other (comment)  interpreter   Methods Explanation;Demonstration;Verbal cues;Handout;Tactile cues   Comprehension Verbalized  understanding;Returned demonstration          PT Short Term Goals - 11/08/15 0857      PT SHORT TERM GOAL #1   Title "Independent with initial HEP   Time 3   Period Weeks   Status On-going     PT SHORT TERM GOAL #2   Title "Report decreased pain by 25%   Baseline 6/10 today/7/10   Time 3   Period Weeks   Status On-going     PT SHORT TERM GOAL #3   Title "Demonstrate understanding of proper sitting posture, body mechanics, work ergonomics, and be more conscious of position and posture throughout the day.    Time 3   Period Weeks   Status On-going           PT Long Term Goals - 11/08/15 0940      PT LONG TERM GOAL #1   Title "Demonstrate and verbalize techniques to reduce the risk of re-injury including: lifting, posture, body mechanics.    Baseline utilizing but says she hurts when she tries to use good sitting and standing posture   Time 6   Period Weeks   Status On-going     PT LONG TERM GOAL #2   Title "Pt will be independent with advanced HEP   Baseline added bridging and supine scapular stabilizers in supine with red t band   Period Weeks   Status On-going     PT LONG TERM GOAL #3   Title "Pain will decrease to 2/10 or less  with all functional activities/ household activities   Baseline 6/10 to 7/10 neck and shoulder   Time 6   Period Weeks   Status On-going     PT LONG TERM GOAL #4   Title Pt will be able to stand for  1 hour using pain management strategies and posture helps in order to complete household tasks and care for twins   Baseline can stand and walk 30 minutes   Time 6   Period Weeks   Status On-going     PT LONG TERM GOAL #5   Title Pt will be able to sleep at night at least 3-4 hours interrupted and  turn at night without awakening from sleep due to pain   Baseline able to sleep at least 2 -3  hours   Time 6   Period Weeks   Status On-going               Plan - 11/08/15 0930    Clinical Impression Statement Pt with  increased AROM in left shoulder Flexion 130 and abd 126.  Pt able to move better today although reported pain 6/10 to 7/10.   Pt encouraged to continue movement to prevent frozen shoulder and deficts that will result from inactivity.  Pt with more smooth motor control with exericies and pt progressed from yellow t band to red t band.  Pt was explained difference in soreness form utilizing muscle and thorbbing from injured muscle.  Pt reports more soreness.  Pt able to perform bridging with good motor control.     Rehab Potential Good   PT Frequency 2x / week   PT Duration 6 weeks   PT Treatment/Interventions ADLs/Self Care Home Management;Cryotherapy;Electrical Stimulation;Moist Heat;Iontophoresis 4mg /ml Dexamethasone;Ultrasound;Therapeutic exercise;Stair training;Gait training;Neuromuscular re-education;Patient/family education;Manual techniques;Passive range of motion;Dry needling;Taping   PT Next Visit Plan continue more movement, nu step good and physio ball   PT Home Exercise Plan supine scapular stabilizers with red t band and briding   Consulted and Agree with Plan of Care Patient;Other (Comment)  interpreter      Patient will benefit from skilled therapeutic intervention in order to improve the following deficits and impairments:  Increased muscle spasms, Decreased activity tolerance, Decreased range of motion, Decreased strength, Pain, Improper body mechanics, Postural dysfunction  Visit Diagnosis: Pain in left shoulder  Low back pain with right-sided sciatica, unspecified back pain laterality  Muscle weakness (generalized)  Stiffness of left shoulder, not elsewhere classified  Abnormal posture  Muscle spasm of back     Problem List Patient Active Problem List   Diagnosis Date Noted  . Urinary retention with incomplete bladder emptying after cesarean section 01/16/2014  . S/P cesarean section 01/13/2014  . Poor fetal growth complicating pregnancy in second trimester,  antepartum 11/03/2013  . Language barrier, cultural differences 09/21/2013  . Advanced maternal age, antepartum condition or complication 07/26/2013  . 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 Polyp of cervix affecting pregnancy in first trimester 07/16/2013  . Monochorionic diamniotic twin pregnancy in first trimester - discordant growth 07/16/2013   Garen Lah, PT 11/08/15 9:45 AM Phone: [phone] Fax: [phone]  [facility] [address] [place], [place], [address] Phone: [phone]   Fax:  [phone]  Name: Zaryiah Barz MRN: [id number] Date of Birth: [birth year]

## 2015-11-14 ENCOUNTER — Ambulatory Visit: Payer: Self-pay | Admitting: Physical Therapy

## 2015-11-15 ENCOUNTER — Ambulatory Visit: Payer: Self-pay | Admitting: Physical Therapy

## 2015-11-15 ENCOUNTER — Encounter: Payer: Self-pay | Admitting: Physical Therapy

## 2015-11-15 DIAGNOSIS — M6281 Muscle weakness (generalized): Secondary | ICD-10-CM

## 2015-11-15 DIAGNOSIS — M5441 Lumbago with sciatica, right side: Secondary | ICD-10-CM

## 2015-11-15 DIAGNOSIS — M25612 Stiffness of left shoulder, not elsewhere classified: Secondary | ICD-10-CM

## 2015-11-15 DIAGNOSIS — M25512 Pain in left shoulder: Secondary | ICD-10-CM

## 2015-11-15 NOTE — Patient Instructions (Signed)
Access Code: [email or web address]  URL: [email or web address]  Date: 11/15/2015  Prepared by: Army FossaJessica Damoni Causby   Exercises  Supine Bridge - 10 reps - 1 sets - 2 hold - 1x daily - 7x weekly  Standing Shoulder External Rotation with Resistance - 10 reps - 1 sets - 2 hold - 1x daily - 7x weekly

## 2015-11-15 NOTE — Therapy (Signed)
[facility] [address] [place], [place], [address] Phone: [phone]   Fax:  [phone]  Physical Therapy Treatment  Patient Details  Name: Jill [name] MRN: [id number] Date of Birth: 01/03/1971 Referring Provider: Beverely LowNorris, Steve, MD  Encounter Date: 11/15/2015      PT End of Session - 11/15/15 1015    Visit Number 7   Number of Visits 12   Date for PT Re-Evaluation 11/20/15   Authorization Type Medicaid / Self-pay   PT Start Time 0930   PT Stop Time 1013   PT Time Calculation (min) 43 min   Activity Tolerance Patient limited by pain   Behavior During Therapy [facility] for tasks assessed/performed      Past Medical History:  Diagnosis Date  . Medical history non-contributory   . Polyp of cervix     Past Surgical History:  Procedure Laterality Date  . CESAREAN SECTION MULTI-GESTATIONAL N/A 01/12/2014   Procedure: CESAREAN SECTION MULTI-GESTATIONAL;  Surgeon: Catalina AntiguaPeggy Constant, MD;  Location: [facility];  Service: Obstetrics;  Laterality: N/A;  . NO PAST SURGERIES      There were no vitals filed for this visit.      Subjective Assessment - 11/15/15 0933    Subjective Pt reports having pain in her shoulder today.    Currently in Pain? Yes   Pain Score 6    Pain Location Shoulder   Pain Orientation Left   Pain Descriptors / Indicators Tightness   Pain Score 6   Pain Location Back   Pain Orientation Lower;Left   Pain Descriptors / Indicators --  cannot describe it   Aggravating Factors  standing 5+ min                         [facility] Adult PT Treatment/Exercise - 11/15/15 0001      Lumbar Exercises: Supine   Bridge 10 reps     Shoulder Exercises: Seated   Retraction 20 reps;Other (comment)  heavy cuing requires   External Rotation 10 reps   External Rotation Limitations heavy cuing for posture   Other Seated Exercises pulleys 3' flx   Other Seated Exercises posterior shoulder rolls     Shoulder Exercises: Standing   Other Standing Exercises upper trap and levator stretches   Other Standing Exercises proper upright posture     Manual Therapy   Manual Therapy Taping   Manual therapy comments STM R UT, suboccipitals, manual traction, low load-long duration upper trap stretch by PT   Kinesiotex Facilitate Muscle     Kinesiotix   Facilitate Muscle  scapular retraction                PT Education - 11/15/15 1305    Education provided Yes   Education Details posture, exercise form/rationale, manual treatments .    Person(s) Educated Patient  via interpreter   Methods Explanation;Demonstration;Tactile cues;Verbal cues;Handout   Comprehension Verbalized understanding;Returned demonstration;Verbal cues required;Tactile cues required;Need further instruction          PT Short Term Goals - 11/08/15 0857      PT SHORT TERM GOAL #1   Title "Independent with initial HEP   Time 3   Period Weeks   Status On-going     PT SHORT TERM GOAL #2   Title "Report decreased pain by 25%   Baseline 6/10 today/7/10   Time 3   Period Weeks   Status On-going     PT SHORT TERM GOAL #  3   Title "Demonstrate understanding of proper sitting posture, body mechanics, work ergonomics, and be more conscious of position and posture throughout the day.    Time 3   Period Weeks   Status On-going           PT Long Term Goals - 11/08/15 0940      PT LONG TERM GOAL #1   Title "Demonstrate and verbalize techniques to reduce the risk of re-injury including: lifting, posture, body mechanics.    Baseline utilizing but says she hurts when she tries to use good sitting and standing posture   Time 6   Period Weeks   Status On-going     PT LONG TERM GOAL #2   Title "Pt will be independent with advanced HEP   Baseline added bridging and supine scapular stabilizers in supine with red t band   Period Weeks   Status On-going     PT LONG TERM GOAL #3   Title "Pain will decrease to  2/10 or less  with all functional activities/ household activities   Baseline 6/10 to 7/10 neck and shoulder   Time 6   Period Weeks   Status On-going     PT LONG TERM GOAL #4   Title Pt will be able to stand for  1 hour using pain management strategies and posture helps in order to complete household tasks and care for twins   Baseline can stand and walk 30 minutes   Time 6   Period Weeks   Status On-going     PT LONG TERM GOAL #5   Title Pt will be able to sleep at night at least 3-4 hours interrupted and  turn at night without awakening from sleep due to pain   Baseline able to sleep at least 2 -3  hours   Time 6   Period Weeks   Status On-going               Plan - 11/15/15 1306    Clinical Impression Statement Pt required extensive cuing today to perform exercises appropriately, translator was very helpful. Pt is unable to distinguish sensations regarding pain v stretch v muscle strengthening. Verbalized decrease in pain following treatment today and verbalized understanding of HEP.    Consulted and Agree with Plan of Care Patient      Patient will benefit from skilled therapeutic intervention in order to improve the following deficits and impairments:     Visit Diagnosis: Pain in left shoulder  Low back pain with right-sided sciatica, unspecified back pain laterality  Muscle weakness (generalized)  Stiffness of left shoulder, not elsewhere classified     Problem List Patient Active Problem List   Diagnosis Date Noted  . Urinary retention with incomplete bladder emptying after cesarean section 01/16/2014  . S/P cesarean section 01/13/2014  . Poor fetal growth complicating pregnancy in second trimester, antepartum 11/03/2013  . Language barrier, cultural differences 09/21/2013  . Advanced maternal age, antepartum condition or complication 07/26/2013  . 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 Polyp of cervix affecting pregnancy in first trimester 07/16/2013  .  Monochorionic diamniotic twin pregnancy in first trimester - discordant growth 07/16/2013    Gwenlyn FoundJessica C. Priyanka Causey PT, DPT 11/15/15 1:11 PM   [facility] [address] [place], [place], [address] Phone: [phone]   Fax:  [phone]  Name: Jill [name] MRN: [id number] Date of Birth: 08/08/1970

## 2015-11-20 ENCOUNTER — Ambulatory Visit: Payer: Self-pay | Admitting: Physical Therapy

## 2015-11-20 DIAGNOSIS — M6281 Muscle weakness (generalized): Secondary | ICD-10-CM

## 2015-11-20 DIAGNOSIS — M25512 Pain in left shoulder: Secondary | ICD-10-CM

## 2015-11-20 DIAGNOSIS — M25612 Stiffness of left shoulder, not elsewhere classified: Secondary | ICD-10-CM

## 2015-11-20 DIAGNOSIS — M5441 Lumbago with sciatica, right side: Secondary | ICD-10-CM

## 2015-11-20 DIAGNOSIS — R293 Abnormal posture: Secondary | ICD-10-CM

## 2015-11-20 DIAGNOSIS — M62830 Muscle spasm of back: Secondary | ICD-10-CM

## 2015-11-20 NOTE — Patient Instructions (Signed)
Contact MD about  grip Left hand.

## 2015-11-20 NOTE — Therapy (Signed)
[facility] [address] [place], [place], [address] Phone: [phone]   Fax:  [phone]  Physical Therapy Treatment  Patient Details  Name: Jill [name] MRN: [id number] Date of Birth: 09/21/1970 Referring Provider: Beverely LowNorris, Steve, MD  Encounter Date: 11/20/2015      PT End of Session - 11/20/15 1319    Visit Number 8   Number of Visits 12   PT Start Time 1147   PT Stop Time 1240   PT Time Calculation (min) 53 min   Activity Tolerance Patient tolerated treatment well   Behavior [facility] Therapy [facility] for tasks assessed/performed      Past Medical History:  Diagnosis Date  . Medical history non-contributory   . Polyp of cervix     Past Surgical History:  Procedure Laterality Date  . CESAREAN SECTION MULTI-GESTATIONAL N/A 01/12/2014   Procedure: CESAREAN SECTION MULTI-GESTATIONAL;  Surgeon: Catalina AntiguaPeggy Constant, MD;  Location: [facility];  Service: Obstetrics;  Laterality: N/A;  . NO PAST SURGERIES      There were no vitals filed for this visit.      Subjective Assessment - 11/20/15 1154    Subjective Pain is better.  Left knee sore,  usually it is in my right knee.    Currently in Pain? Yes   Pain Score 7    Pain Orientation Left   Pain Descriptors / Indicators --  pain   Pain Radiating Towards None today   Aggravating Factors  Night and reaching   Pain Relieving Factors heat, massage   Pain Orientation Left;Mid   Pain Descriptors / Indicators Tightness;Squeezing   Pain Frequency Constant   Aggravating Factors  standing    Pain Relieving Factors rest            [facility] PT Assessment - 11/20/15 0001      Strength   Overall Strength Comments GRIP [id number],14, 13 POUNDS,  LEFT  7,0,0 pounds                     [facility] Adult PT Treatment/Exercise - 11/20/15 0001      Lumbar Exercises: Stretches   Double Knee to Chest Stretch 5 reps;10 seconds   Double Knee to Chest Stretch Limitations legs on red  ball   Lower Trunk Rotation 5 reps   Lower Trunk Rotation Limitations LESS MOTION TO LEFT.  5 x   ITB Stretch 2 reps;10 seconds  Left sidelying to table   Piriformis Stretch 1 rep;30 seconds     Lumbar Exercises: Aerobic   Stationary Bike Nustep arms. legs, level 5 5 minutes     Lumbar Exercises: Supine   Clam 10 reps   Bridge 10 reps  legs on ball   Bridge Limitations some pain noted with lift, however it eased with lowering   Large Ball Abdominal Isometric --  3 SETS 10 x EACH,  BOTH AND SINGLE HANDS.     Knee/Hip Exercises: Machines for Strengthening   Total Gym Leg Press 1 PLATE 10 x PAINFUL     Shoulder Exercises: Seated   Retraction 10 reps     Shoulder Exercises: Pulleys   Flexion Limitations 2 MINUTES     Moist Heat Therapy   Number Minutes Moist Heat 15 Minutes   Moist Heat Location Lumbar Spine;Shoulder  shoulder, upper traps left,  sitting     Manual Therapy   Manual therapy comments instrument assist Left IT band   Soft tissue mobilization Left upper trap, shoulder, to decrease  pain                  PT Short Term Goals - 11/20/15 1327      PT SHORT TERM GOAL #1   Title "Independent with initial HEP   Baseline Says she understands.   Time 3   Period Weeks   Status Achieved     PT SHORT TERM GOAL #2   Title "Report decreased pain by 25%   Baseline Pain varies 4  to 8/10   Time 3   Period Weeks   Status On-going     PT SHORT TERM GOAL #3   Baseline Pain limits posture in clinic   Time 3   Status On-going           PT Long Term Goals - 11/08/15 0940      PT LONG TERM GOAL #1   Title "Demonstrate and verbalize techniques to reduce the risk of re-injury including: lifting, posture, body mechanics.    Baseline utilizing but says she hurts when she tries to use good sitting and standing posture   Time 6   Period Weeks   Status On-going     PT LONG TERM GOAL #2   Title "Pt will be independent with advanced HEP   Baseline added  bridging and supine scapular stabilizers in supine with red t band   Period Weeks   Status On-going     PT LONG TERM GOAL #3   Title "Pain will decrease to 2/10 or less  with all functional activities/ household activities   Baseline 6/10 to 7/10 neck and shoulder   Time 6   Period Weeks   Status On-going     PT LONG TERM GOAL #4   Title Pt will be able to stand for  1 hour using pain management strategies and posture helps in order to complete household tasks and care for twins   Baseline can stand and walk 30 minutes   Time 6   Period Weeks   Status On-going     PT LONG TERM GOAL #5   Title Pt will be able to sleep at night at least 3-4 hours interrupted and  turn at night without awakening from sleep due to pain   Baseline able to sleep at least 2 -3  hours   Time 6   Period Weeks   Status On-going               Plan - 11/20/15 1321    Clinical Impression Statement Grip Left 7, 0,0 pounds left hand. Shoulder AROM flexion 85 degrees which is decreased from last measurement of 130 .  PT [employer] notified of patient's status.    PT Next Visit Plan See what MD says.  Stabilization, stretching   PT Home Exercise Plan Patient to see MD or contact MD about lack of strength Left knee   Consulted and Agree with Plan of Care Patient   Family Member Consulted Husband      Patient will benefit from skilled therapeutic intervention in order to improve the following deficits and impairments:  Increased muscle spasms, Decreased activity tolerance, Decreased range of motion, Decreased strength, Pain, Improper body mechanics, Postural dysfunction  Visit Diagnosis: Pain in left shoulder  Low back pain with right-sided sciatica, unspecified back pain laterality  Muscle weakness (generalized)  Stiffness of left shoulder, not elsewhere classified  Abnormal posture  Muscle spasm of back     Problem List Patient Active Problem List  Diagnosis Date Noted  .  Urinary retention with incomplete bladder emptying after cesarean section 01/16/2014  . S/P cesarean section 01/13/2014  . Poor fetal growth complicating pregnancy in second trimester, antepartum 11/03/2013  . Language barrier, cultural differences 09/21/2013  . Advanced maternal age, antepartum condition or complication 07/26/2013  . 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 Polyp of cervix affecting pregnancy in first trimester 07/16/2013  . Monochorionic diamniotic twin pregnancy in first trimester - discordant growth 07/16/2013    [facility] 11/20/2015, 1:32 PM  [facility] [address] [place], [place], [address] Phone: [phone]   Fax:  [phone]  Name: Jill [name] MRN: [id number] Date of Birth: [birth year]   Liz Beach, PTA 11/20/15 2:45 PM Phone: [phone] Fax: [phone]

## 2015-11-22 ENCOUNTER — Encounter: Payer: Self-pay | Admitting: Physical Therapy

## 2015-11-22 ENCOUNTER — Ambulatory Visit: Payer: Medicaid Other | Admitting: Physical Therapy

## 2015-11-22 DIAGNOSIS — M25512 Pain in left shoulder: Secondary | ICD-10-CM

## 2015-11-22 DIAGNOSIS — M6281 Muscle weakness (generalized): Secondary | ICD-10-CM

## 2015-11-22 DIAGNOSIS — M5441 Lumbago with sciatica, right side: Secondary | ICD-10-CM

## 2015-11-22 NOTE — Therapy (Signed)
[facility] [address] [place], [place], [address] Phone: [phone]   Fax:  [phone]  Physical Therapy Treatment  Patient Details  Name: Jill [name] MRN: [id number] Date of Birth: 1970-08-03 Referring Provider: Beverely Low, MD  Encounter Date: 11/22/2015      PT End of Session - 11/22/15 1145    Visit Number 9   Number of Visits 12   Date for PT Re-Evaluation 11/20/15   Authorization Type Medicaid / Self-pay   PT Start Time 1103   PT Stop Time 1145   PT Time Calculation (min) 42 min   Activity Tolerance Patient limited by pain   Behavior During Therapy [facility] for tasks assessed/performed      Past Medical History:  Diagnosis Date  . Medical history non-contributory   . Polyp of cervix     Past Surgical History:  Procedure Laterality Date  . CESAREAN SECTION MULTI-GESTATIONAL N/A 01/12/2014   Procedure: CESAREAN SECTION MULTI-GESTATIONAL;  Surgeon: Catalina Antigua, MD;  Location: [facility];  Service: Obstetrics;  Laterality: N/A;  . NO PAST SURGERIES      There were no vitals filed for this visit.      Subjective Assessment - 11/22/15 1111    Subjective Pt reprots she is not able to lift her babies due to shoulder pain and lack of grip strength. REports pain at [facility] joint with flexion and along upper trap.    Currently in Pain? Yes   Pain Score 5    Pain Location Shoulder   Pain Orientation Left                         [facility] Adult PT Treatment/Exercise - 11/22/15 0001      Knee/Hip Exercises: Aerobic   Nustep 5' L3 upper and lower extremities     Shoulder Exercises: Supine   Flexion 10 reps   Flexion Limitations hands clasped     Shoulder Exercises: Seated   Other Seated Exercises hand putty 1' each red     Shoulder Exercises: Standing   Extension 20 reps   Theraband Level (Shoulder Extension) Level 1 (Yellow)   Row 20 reps   Theraband Level (Shoulder Row) Level 2 (Red)     Shoulder Exercises: Pulleys   Flexion 3 minutes     Manual Therapy   Manual therapy comments GHJ ROM, STM L upper trap, AC joint mob     Neck Exercises: Stretches   Chest Stretch 5 reps;10 seconds                PT Education - 11/22/15 1259    Education provided Yes   Education Details exercise form/rationale, rationale for manual and movement   Person(s) Educated Patient   Methods Explanation;Demonstration;Tactile cues;Verbal cues   Comprehension Verbalized understanding;Returned demonstration;Verbal cues required;Tactile cues required;Need further instruction          PT Short Term Goals - 11/20/15 1327      PT SHORT TERM GOAL #1   Title "Independent with initial HEP   Baseline Says she understands.   Time 3   Period Weeks   Status Achieved     PT SHORT TERM GOAL #2   Title "Report decreased pain by 25%   Baseline Pain varies 4  to 8/10   Time 3   Period Weeks   Status On-going     PT SHORT TERM GOAL #3   Baseline Pain limits posture in clinic   Time  3   Status On-going           PT Long Term Goals - 11/08/15 0940      PT LONG TERM GOAL #1   Title "Demonstrate and verbalize techniques to reduce the risk of re-injury including: lifting, posture, body mechanics.    Baseline utilizing but says she hurts when she tries to use good sitting and standing posture   Time 6   Period Weeks   Status On-going     PT LONG TERM GOAL #2   Title "Pt will be independent with advanced HEP   Baseline added bridging and supine scapular stabilizers in supine with red t band   Period Weeks   Status On-going     PT LONG TERM GOAL #3   Title "Pain will decrease to 2/10 or less  with all functional activities/ household activities   Baseline 6/10 to 7/10 neck and shoulder   Time 6   Period Weeks   Status On-going     PT LONG TERM GOAL #4   Title Pt will be able to stand for  1 hour using pain management strategies and posture helps in order to complete household  tasks and care for twins   Baseline can stand and walk 30 minutes   Time 6   Period Weeks   Status On-going     PT LONG TERM GOAL #5   Title Pt will be able to sleep at night at least 3-4 hours interrupted and  turn at night without awakening from sleep due to pain   Baseline able to sleep at least 2 -3  hours   Time 6   Period Weeks   Status On-going               Plan - 11/22/15 1300    Clinical Impression Statement Pt reports she is getting better overall but continues to report significant increase in pain levels with movement. Negative spurling's test today and sensation WFL. Pt has been unable to lift children since accident due to pain and decreased grip strength. Pt is very hesitant to allow shoulder movement by PT, translator very helpful in getting pt to relax and perform exercises.  I do have fear that the pt will end up with frozen shoulder due to lack of motion and she was educted on that.    PT Next Visit Plan Shoulder active motion   Consulted and Agree with Plan of Care Patient   Family Member Consulted via translator      Patient will benefit from skilled therapeutic intervention in order to improve the following deficits and impairments:     Visit Diagnosis: Pain in left shoulder  Low back pain with right-sided sciatica, unspecified back pain laterality  Muscle weakness (generalized)     Problem List Patient Active Problem List   Diagnosis Date Noted  . Urinary retention with incomplete bladder emptying after cesarean section 01/16/2014  . S/P cesarean section 01/13/2014  . Poor fetal growth complicating pregnancy in second trimester, antepartum 11/03/2013  . Language barrier, cultural differences 09/21/2013  . Advanced maternal age, antepartum condition or complication 07/26/2013  . 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 Polyp of cervix affecting pregnancy in first trimester 07/16/2013  . Monochorionic diamniotic twin pregnancy in first trimester - discordant  growth 07/16/2013   Gwenlyn FoundJessica C. Rehmat Murtagh PT, DPT 11/22/15 1:03 PM   [facility] [address] [place], [place], [address] Phone: [phone]   Fax:  [phone]  Name: Jill [name] MRN: [id number] Date of Birth: March 15, 1971

## 2015-11-27 ENCOUNTER — Ambulatory Visit: Payer: No Typology Code available for payment source | Attending: Orthopedic Surgery | Admitting: Physical Therapy

## 2015-11-27 DIAGNOSIS — M5441 Lumbago with sciatica, right side: Secondary | ICD-10-CM | POA: Diagnosis present

## 2015-11-27 DIAGNOSIS — M25612 Stiffness of left shoulder, not elsewhere classified: Secondary | ICD-10-CM | POA: Diagnosis present

## 2015-11-27 DIAGNOSIS — R293 Abnormal posture: Secondary | ICD-10-CM | POA: Diagnosis present

## 2015-11-27 DIAGNOSIS — M6281 Muscle weakness (generalized): Secondary | ICD-10-CM | POA: Insufficient documentation

## 2015-11-27 DIAGNOSIS — M25512 Pain in left shoulder: Secondary | ICD-10-CM | POA: Diagnosis not present

## 2015-11-27 DIAGNOSIS — M62830 Muscle spasm of back: Secondary | ICD-10-CM | POA: Insufficient documentation

## 2015-11-27 NOTE — Therapy (Signed)
[facility] [address] [place], [place], [address] Phone: [phone]   Fax:  [phone]  Physical Therapy Treatment  Patient Details  Name: Jill [name] MRN: [id number] Date of Birth: 04/19/1970 Referring Provider: Beverely LowNorris, Steve, MD  Encounter Date: 11/27/2015      PT End of Session - 11/27/15 1824    Activity Tolerance Patient tolerated treatment well;Patient limited by pain   Behavior During Therapy [facility] for tasks assessed/performed      Past Medical History:  Diagnosis Date  . Medical history non-contributory   . Polyp of cervix     Past Surgical History:  Procedure Laterality Date  . CESAREAN SECTION MULTI-GESTATIONAL N/A 01/12/2014   Procedure: CESAREAN SECTION MULTI-GESTATIONAL;  Surgeon: Catalina AntiguaPeggy Constant, MD;  Location: [facility];  Service: Obstetrics;  Laterality: N/A;  . NO PAST SURGERIES      There were no vitals filed for this visit.      Subjective Assessment - 11/27/15 0848    Subjective Pain is better. Did not call MD about her grip.  Continues with left thigh pain and neck pain.   Patient is accompained by: Interpreter   Currently in Pain? Yes   Pain Score 6    Pain Location Shoulder   Pain Orientation Left   Pain Descriptors / Indicators Aching;Shooting   Pain Radiating Towards ribs   Pain Frequency Intermittent   Pain Relieving Factors heat massage   Pain Score 6   Pain Orientation Left;Mid                         [facility] Adult PT Treatment/Exercise - 11/27/15 0001      Lumbar Exercises: Stretches   Passive Hamstring Stretch 3 reps;30 seconds  Strap, HEP   Double Knee to Chest Stretch --  10 X legs on ball   Lower Trunk Rotation --  10  x legs on ball, CGA     Lumbar Exercises: Aerobic   Stationary Bike Nustep 5 minutes level 5.      Lumbar Exercises: Supine   Bridge 10 reps  legs on ball,  cues breathing     Knee/Hip Exercises: Aerobic   Nustep 6 minutes L3 upper  and lower extremities     Shoulder Exercises: Seated   External Rotation 5 reps  Moving elbows,  hands behind head   Flexion 10 reps  painful, limited     Shoulder Exercises: Prone   Other Prone Exercises scapular squeeze 5 X     Shoulder Exercises: Pulleys   Flexion 3 minutes  notes numbness in whole hand, elbow to hands post stretch   Other Pulley Exercises cues needed to hold stretch      Moist Heat Therapy   Number Minutes Moist Heat 10 Minutes   Moist Heat Location Shoulder;Knee     Manual Therapy   Manual therapy comments Left patellar mobilization,  Left upper trap ribs,  light pressure only   Soft tissue mobilization Left upper trap      Neck Exercises: Stretches   Levator Stretch 3 reps;10 seconds   Chest Stretch 3 reps;1 rep                PT Education - 11/27/15 0908    Education provided Yes   Education Details hamstring stretch   Person(s) Educated Patient   Methods Explanation;Demonstration;Tactile cues;Verbal cues;Handout   Comprehension Verbalized understanding;Returned demonstration          PT Short Term  Goals - 11/27/15 1829      PT SHORT TERM GOAL #1   Title "Independent with initial HEP   Time 3   Period Weeks   Status Achieved     PT SHORT TERM GOAL #2   Title "Report decreased pain by 25%   Baseline Pain varies 4  to 8/10   Time 3   Period Weeks   Status On-going     PT SHORT TERM GOAL #3   Title "Demonstrate understanding of proper sitting posture, body mechanics, work ergonomics, and be more conscious of position and posture throughout the day.    Time 3   Period Weeks   Status On-going           PT Long Term Goals - 11/08/15 0940      PT LONG TERM GOAL #1   Title "Demonstrate and verbalize techniques to reduce the risk of re-injury including: lifting, posture, body mechanics.    Baseline utilizing but says she hurts when she tries to use good sitting and standing posture   Time 6   Period Weeks   Status  On-going     PT LONG TERM GOAL #2   Title "Pt will be independent with advanced HEP   Baseline added bridging and supine scapular stabilizers in supine with red t band   Period Weeks   Status On-going     PT LONG TERM GOAL #3   Title "Pain will decrease to 2/10 or less  with all functional activities/ household activities   Baseline 6/10 to 7/10 neck and shoulder   Time 6   Period Weeks   Status On-going     PT LONG TERM GOAL #4   Title Pt will be able to stand for  1 hour using pain management strategies and posture helps in order to complete household tasks and care for twins   Baseline can stand and walk 30 minutes   Time 6   Period Weeks   Status On-going     PT LONG TERM GOAL #5   Title Pt will be able to sleep at night at least 3-4 hours interrupted and  turn at night without awakening from sleep due to pain   Baseline able to sleep at least 2 -3  hours   Time 6   Period Weeks   Status On-going               Plan - 11/27/15 1825    Clinical Impression Statement Continue to make gradual progress with home exercises.  She had numbness from elbow to hands (All fingers ) with pulleys.   Numbness eased after neck and shoulder stetches.    PT Next Visit Plan Shoulder active motion,  Measure, progress toward goals.   PT Home Exercise Plan hamstring stretch   Consulted and Agree with Plan of Care Patient      Patient will benefit from skilled therapeutic intervention in order to improve the following deficits and impairments:  Increased muscle spasms, Decreased activity tolerance, Decreased range of motion, Decreased strength, Pain, Improper body mechanics, Postural dysfunction  Visit Diagnosis: Pain in left shoulder  Low back pain with right-sided sciatica, unspecified back pain laterality  Stiffness of left shoulder, not elsewhere classified  Abnormal posture  Muscle spasm of back  Muscle weakness (generalized)     Problem List Patient Active Problem  List   Diagnosis Date Noted  . Urinary retention with incomplete bladder emptying after cesarean section 01/16/2014  . S/P  cesarean section 01/13/2014  . Poor fetal growth complicating pregnancy in second trimester, antepartum 11/03/2013  . Language barrier, cultural differences 09/21/2013  . Advanced maternal age, antepartum condition or complication 07/26/2013  . 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 Polyp of cervix affecting pregnancy in first trimester 07/16/2013  . Monochorionic diamniotic twin pregnancy in first trimester - discordant growth 07/16/2013    [facility] 11/27/2015, 6:31 PM  [facility] [address] [place], [place], [address] Phone: [phone]   Fax:  [phone]  Name: Jill [name] MRN: [id number] Date of Birth: 1970-12-23   Liz Beach, PTA 11/27/15 6:31 PM Phone: [phone] Fax: [phone]

## 2015-11-27 NOTE — Patient Instructions (Signed)
Hamstring: Towel Stretch (Supine)    Lie on back. Loop towel around foot. Straighten knee and pull foot toward body. Hold _30__ seconds. Relax. Repe3at ___ times. Do _1__ times a day. Repeat with other leg.    Copyright  VHI. All rights reserved.

## 2015-11-29 ENCOUNTER — Ambulatory Visit: Payer: No Typology Code available for payment source | Admitting: Physical Therapy

## 2015-11-29 ENCOUNTER — Encounter: Payer: Self-pay | Admitting: Physical Therapy

## 2015-11-29 DIAGNOSIS — M25512 Pain in left shoulder: Secondary | ICD-10-CM | POA: Diagnosis not present

## 2015-11-29 DIAGNOSIS — M25612 Stiffness of left shoulder, not elsewhere classified: Secondary | ICD-10-CM

## 2015-11-29 DIAGNOSIS — M6281 Muscle weakness (generalized): Secondary | ICD-10-CM

## 2015-11-29 DIAGNOSIS — R293 Abnormal posture: Secondary | ICD-10-CM

## 2015-11-29 DIAGNOSIS — M5441 Lumbago with sciatica, right side: Secondary | ICD-10-CM

## 2015-11-29 DIAGNOSIS — M62830 Muscle spasm of back: Secondary | ICD-10-CM

## 2015-11-29 NOTE — Therapy (Signed)
[place], [place], [address] Phone: [phone]   Fax:  [phone]  Physical Therapy Treatment/Discharge Summary  Patient Details  Name: Jill [name] MRN: [id number] Date of Birth: [birth year] Referring Provider: Netta Cedars, MD  Encounter Date: 11/29/2015      PT End of Session - 11/29/15 0849    Visit Number 11   Number of Visits 12   Authorization Type Medicaid / Self-pay   PT Start Time 1448   PT Stop Time 0933   PT Time Calculation (min) 44 min   Activity Tolerance Patient tolerated treatment well;Patient limited by pain   Behavior During Therapy [facility] for tasks assessed/performed      Past Medical History:  Diagnosis Date  . Medical history non-contributory   . Polyp of cervix     Past Surgical History:  Procedure Laterality Date  . CESAREAN SECTION MULTI-GESTATIONAL N/A 01/12/2014   Procedure: CESAREAN SECTION MULTI-GESTATIONAL;  Surgeon: Mora Bellman, MD;  Location: [facility];  Service: Obstetrics;  Laterality: N/A;  . NO PAST SURGERIES      There were no vitals filed for this visit.      Subjective Assessment - 11/29/15 0850    Subjective yesterday had a lot of ache in her shoulder maybe due to weather.    Currently in Pain? Yes   Pain Score 6    Pain Location Shoulder   Pain Score 6  upon standing   Pain Location Back            [facility] PT Assessment - 11/29/15 0001      AROM   Right Shoulder Flexion 137 Degrees   Right Shoulder ABduction 144 Degrees   Left Shoulder Flexion 114 Degrees   Left Shoulder ABduction 95 Degrees   Cervical Flexion 40   Cervical Extension 12   Cervical - Right Side Bend 18   Cervical - Left Side Bend 20   Cervical - Right Rotation 28   Cervical - Left Rotation 22     Strength   Overall Strength Comments R 10lb, L 7lb                      [facility] Adult PT Treatment/Exercise - 11/29/15 0001      Lumbar Exercises: Stretches    Passive Hamstring Stretch 30 seconds;2 reps     Lumbar Exercises: Supine   Bridge 15 reps  heavy VC for abdominal control     Shoulder Exercises: Seated   Flexion 10 reps  towel slide up wall                PT Education - 11/29/15 1243    Education provided Yes   Education Details lack of progress, exercise form/rationale   Person(s) Educated Patient   Methods Explanation;Demonstration;Tactile cues;Verbal cues   Comprehension Verbalized understanding;Returned demonstration;Verbal cues required;Tactile cues required          PT Short Term Goals - 11/29/15 1249      PT SHORT TERM GOAL #1   Title "Independent with initial HEP   Status Achieved     PT SHORT TERM GOAL #2   Title "Report decreased pain by 25%   Baseline average 6-7/10   Status Not Met     PT SHORT TERM GOAL #3   Title "Demonstrate understanding of proper sitting posture, body mechanics, work ergonomics, and be more conscious of position and posture throughout the day.    Baseline heavy VC required  Status Not Met           PT Long Term Goals - 11/29/15 7001      PT LONG TERM GOAL #1   Title "Demonstrate and verbalize techniques to reduce the risk of re-injury including: lifting, posture, body mechanics.    Baseline high pain levels upon standing 6-7/10   Status Not Met     PT LONG TERM GOAL #2   Title "Pt will be independent with advanced HEP   Baseline independent as it has been established   Status Not Met     PT LONG TERM GOAL #3   Title "Pain will decrease to 2/10 or less  with all functional activities/ household activities   Baseline 6-7/10   Status Not Met     PT LONG TERM GOAL #4   Title Pt will be able to stand for  1 hour using pain management strategies and posture helps in order to complete household tasks and care for twins   Baseline max 15 min   Status Not Met     PT LONG TERM GOAL #5   Title Pt will be able to sleep at night at least 3-4 hours interrupted and   turn at night without awakening from sleep due to pain   Baseline wakes every 2 hours   Status Not Met               Plan - 11/29/15 1245    Clinical Impression Statement Upon re-evaluation today, pt has not made any objective improvements and has decreased in most measures. Pt reports she thinks she is feeling better but Continues to verbalize the same pain levels at each session. Interpreter was helpful to determine that pt understood pain scales and questions that were asked regarding pain and function. Pt was educated on lack of improvement and should return to MD for further testing.    Consulted and Agree with Plan of Care Patient;Family member/caregiver   Family Member Consulted husband, translator assist      Patient will benefit from skilled therapeutic intervention in order to improve the following deficits and impairments:     Visit Diagnosis: Pain in left shoulder  Low back pain with right-sided sciatica, unspecified back pain laterality  Stiffness of left shoulder, not elsewhere classified  Abnormal posture  Muscle spasm of back  Muscle weakness (generalized)     Problem List Patient Active Problem List   Diagnosis Date Noted  . Urinary retention with incomplete bladder emptying after cesarean section 01/16/2014  . S/P cesarean section 01/13/2014  . Poor fetal growth complicating pregnancy in second trimester, antepartum 11/03/2013  . Language barrier, cultural differences 09/21/2013  . Advanced maternal age, antepartum condition or complication [id number]  . 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 Polyp of cervix affecting pregnancy in first trimester 07/16/2013  . Monochorionic diamniotic twin pregnancy in first trimester - discordant growth 07/16/2013    Gay Filler. Drevon Plog PT, DPT 11/29/15 12:55 PM   [facility] [address] [place], [place], [address] Phone: [phone]   Fax:  [phone]  Name:  Jill [name] MRN: [id number] Date of Birth: [birth year]  PHYSICAL THERAPY DISCHARGE SUMMARY  Visits from Start of Care: 11  Current functional level related to goals / functional outcomes: See above   Remaining deficits: See above   Education / Equipment: Anatomy of condition, POC, HEP, exercise form/rationale  Plan: Patient agrees to discharge.  Patient goals were not met. Patient is being discharged due to  lack of progress.  ?????

## 2016-09-12 ENCOUNTER — Emergency Department (HOSPITAL_COMMUNITY): Payer: Self-pay

## 2016-09-12 ENCOUNTER — Encounter (HOSPITAL_COMMUNITY): Payer: Self-pay | Admitting: Nurse Practitioner

## 2016-09-12 ENCOUNTER — Emergency Department (HOSPITAL_COMMUNITY)
Admission: EM | Admit: 2016-09-12 | Discharge: 2016-09-12 | Disposition: A | Discharge status: Home / Self Care | Payer: Self-pay | Attending: Emergency Medicine | Admitting: Emergency Medicine

## 2016-09-12 DIAGNOSIS — R112 Nausea with vomiting, unspecified: Secondary | ICD-10-CM | POA: Insufficient documentation

## 2016-09-12 DIAGNOSIS — R110 Nausea: Secondary | ICD-10-CM | POA: Insufficient documentation

## 2016-09-12 DIAGNOSIS — Z79899 Other long term (current) drug therapy: Secondary | ICD-10-CM | POA: Insufficient documentation

## 2016-09-12 DIAGNOSIS — K529 Noninfective gastroenteritis and colitis, unspecified: Secondary | ICD-10-CM | POA: Insufficient documentation

## 2016-09-12 DIAGNOSIS — R1110 Vomiting, unspecified: Secondary | ICD-10-CM | POA: Insufficient documentation

## 2016-09-12 DIAGNOSIS — K5000 Crohn's disease of small intestine without complications: Secondary | ICD-10-CM | POA: Insufficient documentation

## 2016-09-12 DIAGNOSIS — R197 Diarrhea, unspecified: Secondary | ICD-10-CM

## 2016-09-12 LAB — CBC WITH DIFFERENTIAL/PLATELET
BASOS ABS: 0 10*3/uL (ref 0.0–0.1)
BASOS PCT: 0 %
Eosinophils Absolute: 0 10*3/uL (ref 0.0–0.7)
Eosinophils Relative: 0 %
HEMATOCRIT: 35.3 % — AB (ref 36.0–46.0)
Hemoglobin: 12.5 g/dL (ref 12.0–15.0)
LYMPHS PCT: 6 %
Lymphs Abs: 0.9 10*3/uL (ref 0.7–4.0)
MCH: 31.1 pg (ref 26.0–34.0)
MCHC: 35.4 g/dL (ref 30.0–36.0)
MCV: 87.8 fL (ref 78.0–100.0)
Monocytes Absolute: 0.7 10*3/uL (ref 0.1–1.0)
Monocytes Relative: 5 %
NEUTROS ABS: 11.9 10*3/uL — AB (ref 1.7–7.7)
NEUTROS PCT: 89 %
Platelets: 197 10*3/uL (ref 150–400)
RBC: 4.02 MIL/uL (ref 3.87–5.11)
RDW: 11.7 % (ref 11.5–15.5)
WBC: 13.5 10*3/uL — ABNORMAL HIGH (ref 4.0–10.5)

## 2016-09-12 LAB — COMPREHENSIVE METABOLIC PANEL
ALK PHOS: 48 U/L (ref 38–126)
ALT: 14 U/L (ref 14–54)
AST: 19 U/L (ref 15–41)
Albumin: 4 g/dL (ref 3.5–5.0)
Anion gap: 8 (ref 5–15)
BILIRUBIN TOTAL: 0.6 mg/dL (ref 0.3–1.2)
BUN: 7 mg/dL (ref 6–20)
CALCIUM: 8.4 mg/dL — AB (ref 8.9–10.3)
CO2: 23 mmol/L (ref 22–32)
CREATININE: 0.61 mg/dL (ref 0.44–1.00)
Chloride: 106 mmol/L (ref 101–111)
GFR calc Af Amer: >60 mL/min (ref >60)
GFR calc non Af Amer: >60 mL/min (ref >60)
Glucose, Bld: 100 mg/dL — ABNORMAL HIGH (ref 65–99)
Potassium: 3.1 mmol/L — ABNORMAL LOW (ref 3.5–5.1)
Sodium: 137 mmol/L (ref 135–145)
Total Protein: 6.7 g/dL (ref 6.5–8.1)

## 2016-09-12 LAB — URINALYSIS, ROUTINE W REFLEX MICROSCOPIC
BILIRUBIN URINE: NEGATIVE
Glucose, UA: NEGATIVE mg/dL
Ketones, ur: NEGATIVE mg/dL
LEUKOCYTES UA: NEGATIVE
Nitrite: NEGATIVE
Protein, ur: NEGATIVE mg/dL
Specific Gravity, Urine: 1.004 — ABNORMAL LOW (ref 1.005–1.030)
pH: 6 (ref 5.0–8.0)

## 2016-09-12 LAB — PREGNANCY, URINE: PREG TEST UR: NEGATIVE

## 2016-09-12 LAB — LIPASE, BLOOD: Lipase: 26 U/L (ref 11–51)

## 2016-09-12 MED ORDER — SODIUM CHLORIDE 0.9 % IV BOLUS (SEPSIS)
1000.0000 mL | Freq: Once | INTRAVENOUS | Status: AC
  Administered 2016-09-12: 1000 mL via INTRAVENOUS

## 2016-09-12 MED ORDER — MORPHINE SULFATE (PF) 4 MG/ML IV SOLN
4.0000 mg | Freq: Once | INTRAVENOUS | Status: AC
  Administered 2016-09-12: 4 mg via INTRAVENOUS

## 2016-09-12 MED ORDER — MORPHINE SULFATE (PF) 4 MG/ML IV SOLN
INTRAVENOUS | Status: AC
  Filled 2016-09-12: qty 1

## 2016-09-12 MED ORDER — IOPAMIDOL (ISOVUE-300) INJECTION 61%
INTRAVENOUS | Status: AC
  Filled 2016-09-12: qty 100

## 2016-09-12 MED ORDER — ONDANSETRON HCL 4 MG PO TABS: 4.0000 mg | ORAL_TABLET | Freq: Three times a day (TID) | ORAL | 0 refills | Status: AC | PRN

## 2016-09-12 MED ORDER — CIPROFLOXACIN HCL 500 MG PO TABS: 500.0000 mg | ORAL_TABLET | Freq: Two times a day (BID) | ORAL | 0 refills | Status: AC

## 2016-09-12 MED ORDER — IOPAMIDOL (ISOVUE-300) INJECTION 61%
100.0000 mL | Freq: Once | INTRAVENOUS | Status: AC | PRN
  Administered 2016-09-12: 100 mL via INTRAVENOUS

## 2016-09-12 MED ORDER — ONDANSETRON HCL 4 MG/2ML IJ SOLN
4.0000 mg | Freq: Once | INTRAMUSCULAR | Status: AC
  Administered 2016-09-12: 4 mg via INTRAVENOUS
  Filled 2016-09-12: qty 2

## 2016-09-12 MED ORDER — METRONIDAZOLE 500 MG PO TABS: 500.0000 mg | ORAL_TABLET | Freq: Two times a day (BID) | ORAL | 0 refills | Status: AC

## 2016-09-12 MED ORDER — MORPHINE SULFATE (PF) 4 MG/ML IV SOLN
4.0000 mg | Freq: Once | INTRAVENOUS | Status: AC
  Administered 2016-09-12: 4 mg via INTRAVENOUS
  Filled 2016-09-12: qty 1

## 2016-09-12 MED ORDER — HYDROCODONE-ACETAMINOPHEN 5-325 MG PO TABS: 1.0000 | ORAL_TABLET | ORAL | 0 refills | Status: AC | PRN

## 2016-09-12 NOTE — Discharge Instructions (Signed)
Your CT shows inflammation or infection of the small bowel. You will need to see a GI doctor for follow-up to make sure it resolves and you may need colonoscopy for evaluation. Please return without fail for worsening symptoms, including fever, worsening pain, intractable vomiting or any other symptoms concerning to you. Take medications as prescribed.

## 2016-09-12 NOTE — ED Provider Notes (Signed)
[facility] Provider Note   CSN: [id number] Arrival date & time: 09/12/16  [id number]     History   Chief Complaint Chief Complaint  Patient presents with  . Abdominal Pain  . Nausea  . Emesis  . Diarrhea    HPI Rosamaria LintsKhujita Longbottom is a 46 y.o. female.  The history is provided by the spouse.  Abdominal Pain   This is a new problem. The current episode started 12 to 24 hours ago. The problem occurs constantly. The problem has been gradually worsening. The pain is associated with suspicious food intake. The pain is located in the generalized abdominal region. The pain is severe. Associated symptoms include diarrhea, nausea and vomiting. Pertinent negatives include fever, hematochezia, melena, dysuria, frequency and hematuria. Nothing aggravates the symptoms. Nothing relieves the symptoms.  Emesis   Associated symptoms include abdominal pain and diarrhea. Pertinent negatives include no fever.  Diarrhea   Associated symptoms include abdominal pain and vomiting.   History provided by patient's husband. She ate greasy food yesterday evening and woke up earlier this AM with nausea, vomiting, nonbloody diarrhea and generalized abdominal pain. No fever or chills, urinary complaints. Husband ate similar food but did not develop any symptoms.   No past medical history on file.  There are no active problems to display for this patient.   No past surgical history on file.  OB History    No data available       Home Medications    Prior to Admission medications   Not on File    Family History No family history on file.  Social History Social History  Substance Use Topics  . Smoking status: Not on file  . Smokeless tobacco: Not on file  . Alcohol use Not on file     Allergies   Patient has no allergy information on record.   Review of Systems Review of Systems  Constitutional: Negative for fever.  Respiratory: Negative for shortness of breath.   Cardiovascular:  Negative for chest pain.  Gastrointestinal: Positive for abdominal pain, diarrhea, nausea and vomiting. Negative for hematochezia and melena.  Genitourinary: Negative for dysuria, frequency and hematuria.  Neurological: Positive for light-headedness.  Hematological: Does not bruise/bleed easily.  Psychiatric/Behavioral: Negative for confusion.  All other systems reviewed and are negative.    Physical Exam Updated Vital Signs BP 108/88 (BP Location: Left Arm)   Pulse 87   Temp 98.8 F (37.1 C) (Oral)   Resp 18   SpO2 98%   Physical Exam Physical Exam  Nursing note and vitals reviewed. Constitutional non-toxic, and in no acute distress Head: Normocephalic and atraumatic.  Mouth/Throat: Oropharynx is clear and dry mucous membranes.  Neck: Normal range of motion. Neck supple.  Cardiovascular: Near tachycardic rate and regular rhythm.   Pulmonary/Chest: Effort normal and breath sounds normal.  Abdominal: Soft. There is generalized tenderness. There is no rebound and no guarding.  Musculoskeletal: Normal range of motion. no edema Neurological: Alert, no facial droop, fluent speech, moves all extremities symmetrically Skin: Skin is warm and dry.  Psychiatric: Cooperative   ED Treatments / Results  Labs (all labs ordered are listed, but only abnormal results are displayed) Labs Reviewed - No data to display  EKG  EKG Interpretation None       Radiology No results found.  Procedures Procedures (including critical care time)  Medications Ordered in ED Medications - No data to display   Initial Impression / Assessment and Plan / ED Course  I have  reviewed the triage vital signs and the nursing notes.  Pertinent labs & imaging results that were available during my care of the patient were reviewed by me and considered in my medical decision making (see chart for details).     46 year old female, previously healthy, who presents with abdominal pain, nausea,  vomiting, and diarrhea. Afebrile and hemodynamically stable. Appears dry on exam. Was soft and non-peritoneal abdomen, but diffusely tender. Differential includes appendicitis versus gastroenteritis versus colitis. CT abdomen and pelvis was performed and visualized. There is evidence of terminal ileitis which could be infectious versus inflammatory. Blood work notable for mild leukocytosis of 13.5. Remainder of workup and urinalysis is reassuring. Patient feels improved after symptomatic treatment, and requested she be managed symptoms from home. We'll discharge with supportive care and cipro/flagyl. GI referral provided given differential for her ileitis also includes IBD. Strict return and follow-up instructions reviewed. She and her husband expressed understanding of all discharge instructions and felt comfortable with the plan of care.   Final Clinical Impressions(s) / ED Diagnoses   Final diagnoses:  Claris Che, MD 09/12/16 1651

## 2016-09-12 NOTE — ED Triage Notes (Signed)
Patient arrives to [facility] via [facility] from home for complaints of epigastric abdominal pain, nausea, vomiting, and diarrhea that started yesterday. She is non-english speaking. Translator utilized for all patient interactions.

## 2016-09-12 NOTE — ED Notes (Signed)
Bed: [id number] Expected date:  Expected time:  Means of arrival:  Comments: EMS-N/V/D

## 2016-09-12 NOTE — ED Triage Notes (Signed)
Patient presents to [facility] via [facility] from home for complaints of nausea, vomiting, diarrhea, and upper abdominal pain 4/10. Patient speaks afghan language.

## 2016-09-12 NOTE — ED Provider Notes (Signed)
[facility] Provider Note   CSN: [id number] Arrival date & time: 09/12/16  4098     History   Chief Complaint Chief Complaint  Patient presents with  . Abdominal Pain  . Nausea  . Emesis  . Diarrhea    HPI Jill [name] is a 46 y.o. female. Offered interpreter but patient comfortable with her husband interpreting for her.   The history is provided by the spouse. The history is limited by a language barrier.  Abdominal Pain   This is a new problem. The current episode started 6 to 12 hours ago. The problem occurs constantly. The problem has not changed since onset.The pain is associated with suspicious food intake. The pain is located in the generalized abdominal region. The pain is severe. Associated symptoms include diarrhea, nausea and vomiting. Pertinent negatives include fever, hematochezia, melena, dysuria and frequency. The symptoms are aggravated by certain positions and palpation. Nothing relieves the symptoms.  Emesis   Associated symptoms include abdominal pain and diarrhea. Pertinent negatives include no fever.  Diarrhea   Associated symptoms include abdominal pain and vomiting.   History of multiple cesarean sections. On set of constant gradually worsening severe generalized abdominal pain last night with n/v/d. Ate greasy foods prior, but husband not sick and ate similar foods. Took OTC medications and muscle relaxant without improvement. No urinary complaints, fever or chills.  Past Medical History:  Diagnosis Date  . Medical history non-contributory   . Polyp of cervix     Patient Active Problem List   Diagnosis Date Noted  . Urinary retention with incomplete bladder emptying after cesarean section 01/16/2014  . S/P cesarean section 01/13/2014  . Poor fetal growth complicating pregnancy in second trimester, antepartum 11/03/2013  . Language barrier, cultural differences 09/21/2013  . Advanced maternal age, antepartum condition or complication  07/26/2013  . 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 Polyp of cervix affecting pregnancy in first trimester 07/16/2013  . Monochorionic diamniotic twin pregnancy in first trimester - discordant growth 07/16/2013    Past Surgical History:  Procedure Laterality Date  . CESAREAN SECTION MULTI-GESTATIONAL N/A 01/12/2014   Procedure: CESAREAN SECTION MULTI-GESTATIONAL;  Surgeon: Catalina Antigua, MD;  Location: [facility];  Service: Obstetrics;  Laterality: N/A;  . NO PAST SURGERIES      OB History    Gravida Para Term Preterm AB Living   1 1   1   2    SAB TAB Ectopic Multiple Live Births         1 2       Home Medications    Prior to Admission medications   Medication Sig Start Date End Date Taking? Authorizing Provider  acetaminophen (TYLENOL) 325 MG tablet Take 650 mg by mouth every 6 (six) hours as needed.    [provider]  ciprofloxacin (CIPRO) 500 MG tablet Take 1 tablet (500 mg total) by mouth every 12 (twelve) hours. 09/12/16   Lavera Guise, MD  cyclobenzaprine (FLEXERIL) 5 MG tablet Take 1 tablet (5 mg total) by mouth 3 (three) times daily as needed for muscle spasms. 07/13/15   Charm Rings, MD  diclofenac sodium (VOLTAREN) 1 % GEL Apply 1 application topically 4 (four) times daily. Patient not taking: Reported on 10/09/2015 07/27/15   Hayden Rasmussen, NP  fluconazole (DIFLUCAN) 150 MG tablet Take 1 tablet (150 mg total) by mouth once. Can take additional dose three days later if symptoms persist Patient not taking: Reported on 10/09/2015 08/28/15   Dorathy Kinsman, CNM  HYDROcodone-acetaminophen [facility])  5-325 MG tablet Take 1 tablet by mouth every 4 (four) hours as needed for severe pain. 07/13/15   Charm Rings, MD  HYDROcodone-acetaminophen (NORCO/VICODIN) 5-325 MG tablet Take 1 tablet by mouth every 4 (four) hours as needed for moderate pain. 09/12/16   Lavera Guise, MD  meloxicam (MOBIC) 7.5 MG tablet Take 1 tablet (7.5 mg total) by mouth daily as needed for pain. 07/13/15   Charm Rings,  MD  metroNIDAZOLE (FLAGYL) 500 MG tablet Take 1 tablet (500 mg total) by mouth 2 (two) times daily. 08/28/15   Katrinka Blazing, [place], CNM  metroNIDAZOLE (FLAGYL) 500 MG tablet Take 1 tablet (500 mg total) by mouth 2 (two) times daily. 09/12/16   Lavera Guise, MD  naproxen (NAPROSYN) 375 MG tablet Take 1 tablet (375 mg total) by mouth 2 (two) times daily. 08/11/15   Hayden Rasmussen, NP  ondansetron (ZOFRAN) 4 MG tablet Take 1 tablet (4 mg total) by mouth every 8 (eight) hours as needed for nausea or vomiting. 09/12/16   Lavera Guise, MD  predniSONE (DELTASONE) 50 MG tablet Take 1 pill daily for 5 days. 07/13/15   Charm Rings, MD  traMADol-acetaminophen (ULTRACET) 37.5-325 MG tablet Take 1 tablet by mouth every 6 (six) hours as needed. 07/27/15   Hayden Rasmussen, NP    Family History Family History  Problem Relation Age of Onset  . Diabetes Mother   . Heart disease Mother     Social History Social History  Substance Use Topics  . Smoking status: Never Smoker  . Smokeless tobacco: Never Used  . Alcohol use No     Allergies   Patient has no known allergies.   Review of Systems Review of Systems  Constitutional: Negative for fever.  Respiratory: Negative for shortness of breath.   Cardiovascular: Negative for chest pain.  Gastrointestinal: Positive for abdominal pain, diarrhea, nausea and vomiting. Negative for hematochezia and melena.  Genitourinary: Negative for dysuria and frequency.  All other systems reviewed and are negative.    Physical Exam Updated Vital Signs BP 102/79   Pulse 99   Temp 98.1 F (36.7 C) (Oral)   Resp 16   SpO2 100%   Physical Exam Physical Exam  Nursing note and vitals reviewed. Constitutional: Well developed, well nourished, non-toxic, appears uncomfortable due to pain Head: Normocephalic and atraumatic.  Mouth/Throat: Oropharynx is clear and moist.  Neck: Normal range of motion. Neck supple.  Cardiovascular: Normal rate and regular rhythm.     Pulmonary/Chest: Effort normal and breath sounds normal.  Abdominal: Soft. There is diffuse generalized tenderness. There is no rebound and no guarding. no distension. Musculoskeletal: Normal range of motion.  Neurological: Alert, no facial droop, moves all extremities symmetrically Skin: Skin is warm and dry.  Psychiatric: Cooperative   ED Treatments / Results  Labs (all labs ordered are listed, but only abnormal results are displayed) Labs Reviewed  CBC WITH DIFFERENTIAL/PLATELET - Abnormal; Notable for the following:       Result Value   WBC 13.5 (*)    HCT 35.3 (*)    Neutro Abs 11.9 (*)    All other components within normal limits  COMPREHENSIVE METABOLIC PANEL - Abnormal; Notable for the following:    Potassium 3.1 (*)    Glucose, Bld 100 (*)    Calcium 8.4 (*)    All other components within normal limits  URINALYSIS, ROUTINE W REFLEX MICROSCOPIC - Abnormal; Notable for the following:    Color, Urine STRAW (*)  Specific Gravity, Urine 1.004 (*)    Hgb urine dipstick SMALL (*)    Bacteria, UA RARE (*)    Squamous Epithelial / LPF 0-5 (*)    All other components within normal limits  LIPASE, BLOOD  PREGNANCY, URINE    EKG  EKG Interpretation None       Radiology Ct Abdomen Pelvis W Contrast  Result Date: 09/12/2016 CLINICAL DATA:  Abdominal pain since yesterday nausea, vomiting, diarrhea and elevated white blood cell count. EXAM: CT ABDOMEN AND PELVIS WITH CONTRAST TECHNIQUE: Multidetector CT imaging of the abdomen and pelvis was performed using the standard protocol following bolus administration of intravenous contrast. CONTRAST:  100 cc Isovue-300 COMPARISON:  None. FINDINGS: Lower chest: The lung bases are clear of acute process. No pleural effusion or pulmonary lesions. The heart is normal in size. No pericardial effusion. The distal esophagus and aorta are unremarkable. Hepatobiliary: No focal hepatic lesions or intrahepatic biliary dilatation. The  gallbladder appears normal. No common bile duct dilatation. Pancreas: No mass, inflammation or ductal dilatation. Spleen: Normal size.  No focal lesions. Adrenals/Urinary Tract: The adrenal glands kidneys are unremarkable. No renal, ureteral or bladder calculi or mass. Stomach/Bowel: The stomach and duodenum are unremarkable. There is a fairly significant inflammatory process involving the distal and terminal ileum. This could be an infectious or inflammatory ileitis and Crohn's disease would certainly be a strong consideration. The colon is unremarkable. No inflammatory changes, mass lesions or obstructive findings. Scattered sigmoid diverticulosis without findings for acute diverticulitis. The appendix is normal. Vascular/Lymphatic: The aorta is normal in caliber. No dissection. The branch vessels are patent. The major venous structures are patent. No mesenteric or retroperitoneal mass or adenopathy. Small scattered lymph nodes are noted. Reproductive: Simple appearing 3.0 x 2.6 cm cyst associate with the left ovary. Right ovary is normal. The uterus appears normal. Other: No pelvic mass or pelvic lymphadenopathy. Small amount of fluid surrounding the inflamed distal ileum. There is a small periumbilical abdominal wall hernia containing fat and a loop of small bowel but no obstruction or incarceration. Musculoskeletal: No significant bony findings. IMPRESSION: 1. Inflammatory or infectious distal and terminal ileitis. Crohn's disease would certainly be a strong consideration. 2. Simple appearing 3 cm cyst associated with the left ovary. 3. Periumbilical abdominal wall hernia containing fat and a small bowel loop but no incarceration or obstruction. Electronically Signed   By: Rudie Meyer M.D.   On: 09/12/2016 13:40    Procedures Procedures (including critical care time)  Medications Ordered in ED Medications  iopamidol (ISOVUE-300) 61 % injection (  Canceled Entry 09/12/16 1304)  morphine 4 MG/ML  injection (  Not Given 09/12/16 1252)  sodium chloride 0.9 % bolus 1,000 mL (0 mLs Intravenous Stopped 09/12/16 1139)  ondansetron (ZOFRAN) injection 4 mg (4 mg Intravenous Given 09/12/16 1039)  morphine 4 MG/ML injection 4 mg (4 mg Intravenous Given 09/12/16 1039)  iopamidol (ISOVUE-300) 61 % injection 100 mL (100 mLs Intravenous Contrast Given 09/12/16 1304)  morphine 4 MG/ML injection 4 mg (4 mg Intravenous Given 09/12/16 1252)  sodium chloride 0.9 % bolus 1,000 mL (1,000 mLs Intravenous New Bag/Given 09/12/16 1456)     Initial Impression / Assessment and Plan / ED Course  I have reviewed the triage vital signs and the nursing notes.  Pertinent labs & imaging results that were available during my care of the patient were reviewed by me and considered in my medical decision making (see chart for details).  46 year old female, previously healthy, who  presents with nausea, vomiting, diarrhea and abdominal pain. He is afebrile, mildly tachycardic, and hemodynamically stable. Soft and non-peritoneal abdomen, but diffuse tenderness is noted. Differential includes potential appendicitis versus colitis. CT abdomen and pelvis is obtained, visualized and shows evidence of terminal ileitis. Blood work with mild leukocytosis of 13.5. Remainder of blood work and urine are reassuring. Patient feels improved with pain, nausea management and IV fluids. She requests that she manage symptoms from home. Will trial Cipro and Flagyl. GI follow-up given for reevaluation after treatment, as differential for her ileitis also includes IBD. Strict return and follow-up instructions reviewed. She and her husband expressed understanding of all discharge instructions and felt comfortable with the plan of care.   Final Clinical Impressions(s) / ED Diagnoses   Final diagnoses:  Nausea vomiting and diarrhea  Terminal ileitis without complication (HCC)    New Prescriptions New Prescriptions   CIPROFLOXACIN (CIPRO) 500 MG  TABLET    Take 1 tablet (500 mg total) by mouth every 12 (twelve) hours.   HYDROCODONE-ACETAMINOPHEN (NORCO/VICODIN) 5-325 MG TABLET    Take 1 tablet by mouth every 4 (four) hours as needed for moderate pain.   METRONIDAZOLE (FLAGYL) 500 MG TABLET    Take 1 tablet (500 mg total) by mouth 2 (two) times daily.   ONDANSETRON (ZOFRAN) 4 MG TABLET    Take 1 tablet (4 mg total) by mouth every 8 (eight) hours as needed for nausea or vomiting.     Lavera GuiseLiu, Kitai Purdom Duo, MD 09/12/16 [phone]

## 2016-09-12 NOTE — ED Notes (Signed)
[employer] phone interpreter used for translation for CT. Additional pain medication requested by pt. Ok'd by Dr. Verdie MosherLiu. Pt ambulated to bathroom with husband.

## 2018-05-18 IMAGING — CT CT ABD-PELV W/ CM
2 of 5 series · 15 of 46 positions shown, 17 images · IV contrast (ISOVUE)
Comparison: None.

CLINICAL DATA: Abdominal pain since yesterday nausea, vomiting,
diarrhea and elevated white blood cell count.

EXAM:
CT ABDOMEN AND PELVIS WITH CONTRAST
TECHNIQUE: Multidetector CT imaging of the abdomen and pelvis was performed
using the standard protocol following bolus administration of
intravenous contrast.
CONTRAST:  100 cc [id number]

[Series 2: abd/pel with · axial · 0.76mm/px · z∈[-714,-324]mm · 12 of 91 slices shown, 14 images]
[im 7/91  soft-tissue]
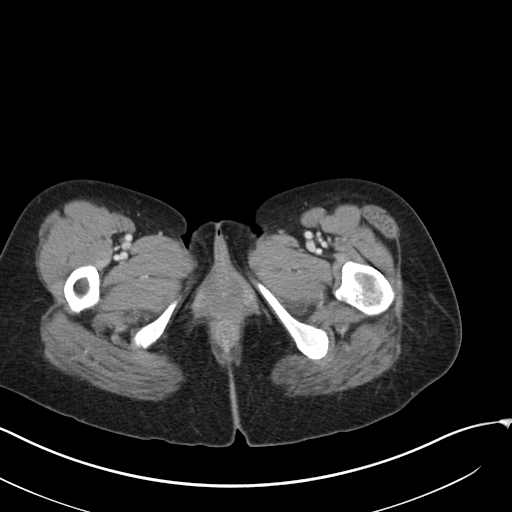
[im 7/91  bone]
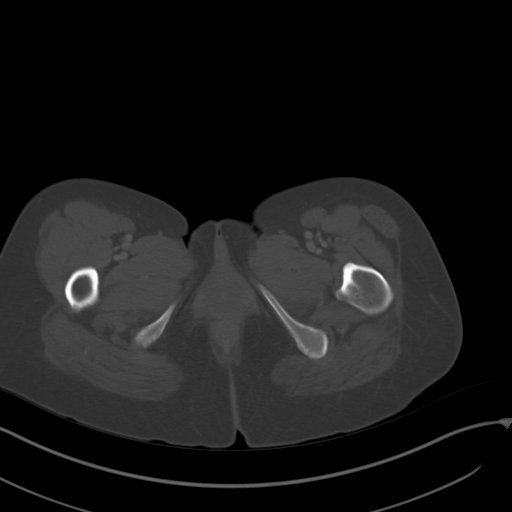
[im 13/91  soft-tissue]
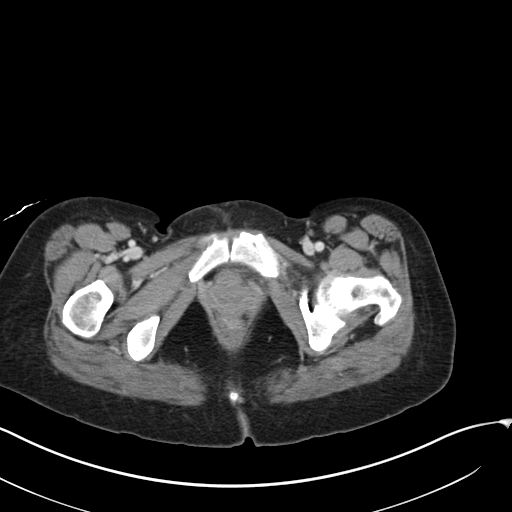
[im 19/91  soft-tissue]
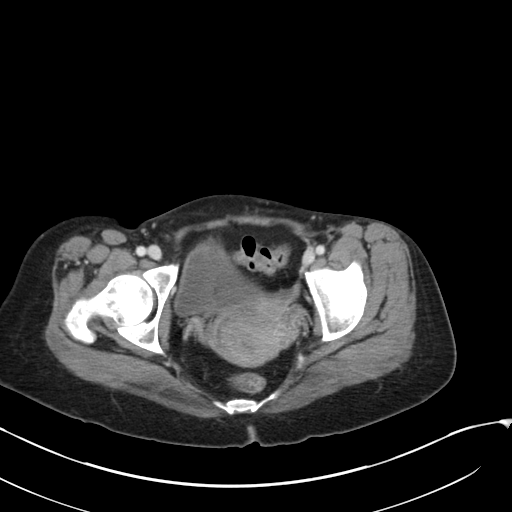
[im 31/91  soft-tissue]
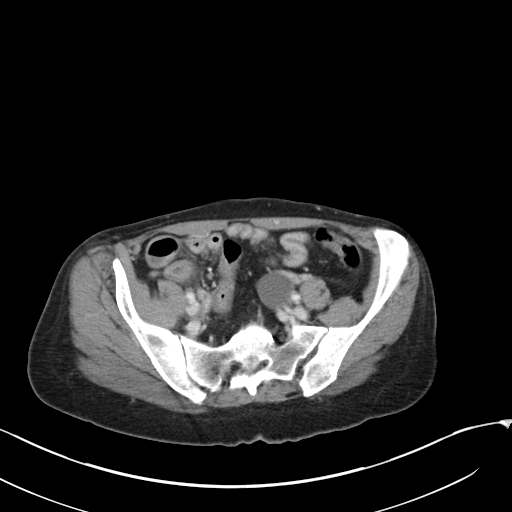
[im 37/91  soft-tissue]
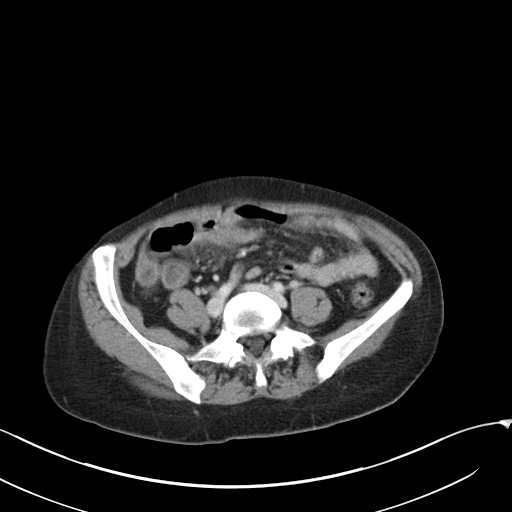
[im 43/91  soft-tissue]
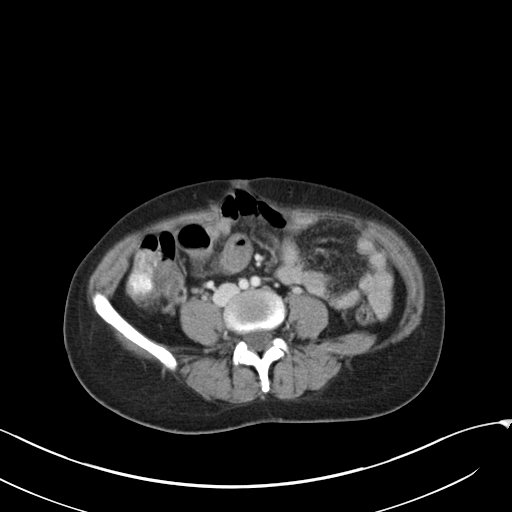
[im 49/91  soft-tissue]
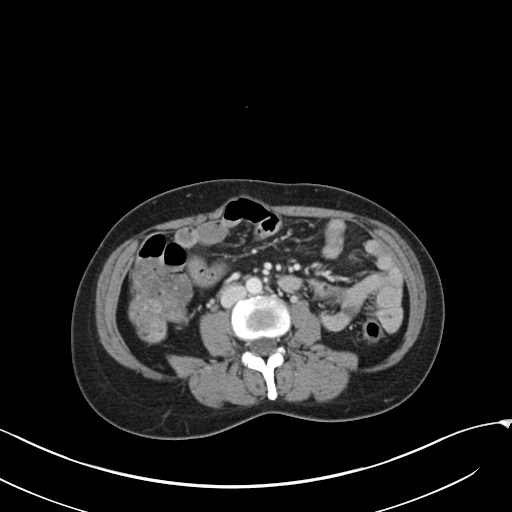
[im 55/91  soft-tissue]
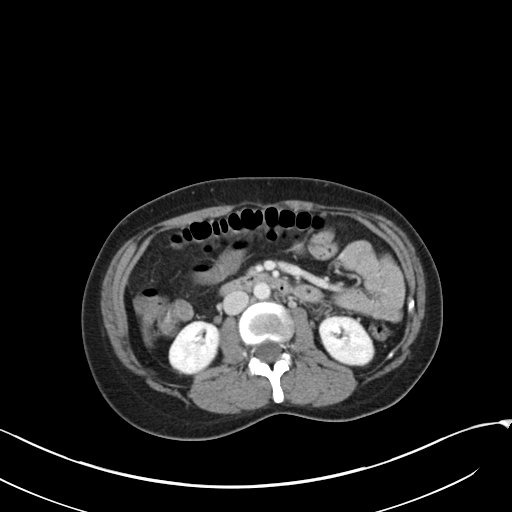
[im 61/91  soft-tissue]
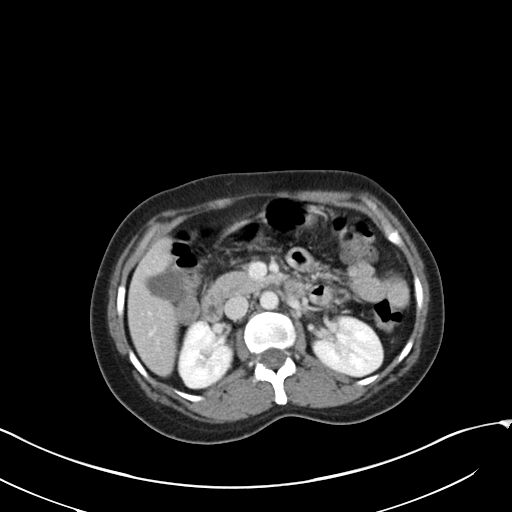
[im 61/91  bone]
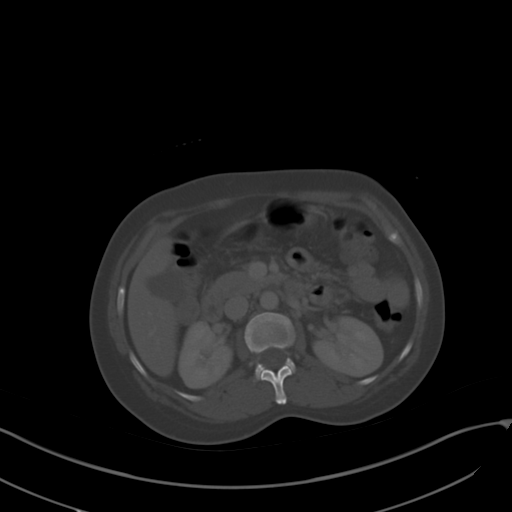
[im 73/91  soft-tissue]
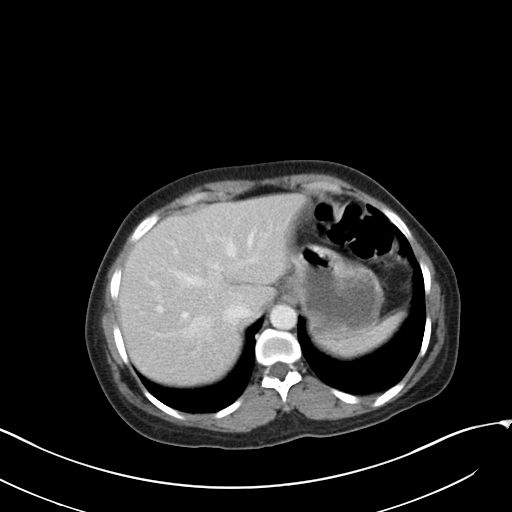
[im 79/91  soft-tissue]
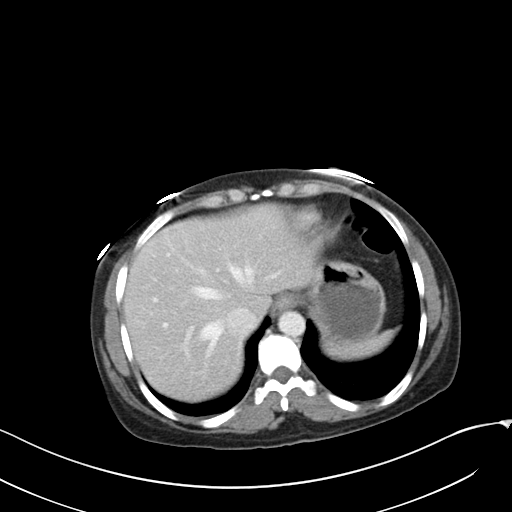
[im 85/91  soft-tissue]
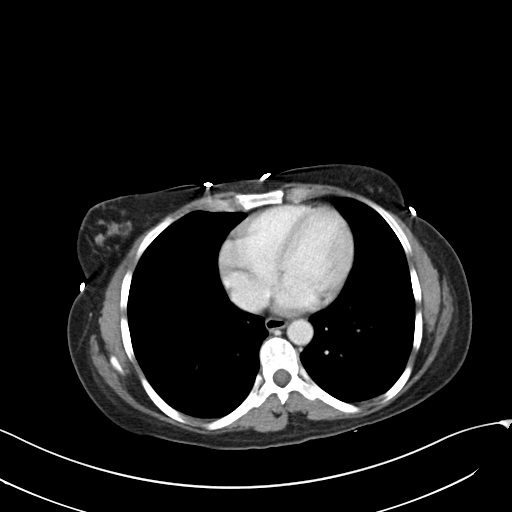

[Series 3: coronal a/|p · coronal · 0.66mm/px · 3 of 112 slices shown]
[im 38/112  soft-tissue]
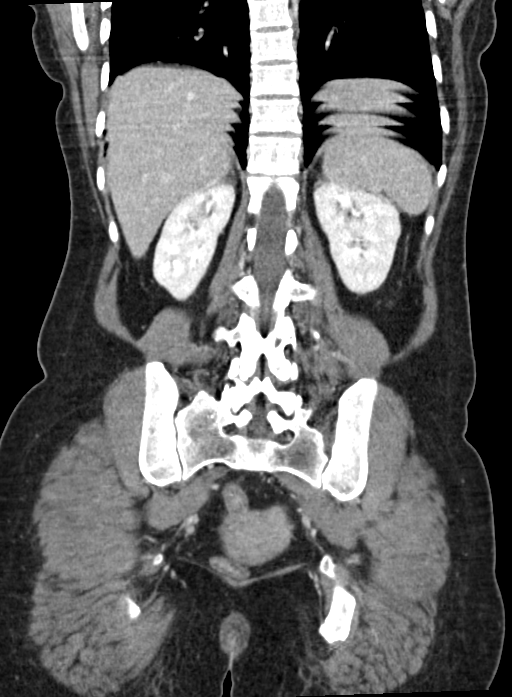
[im 50/112  soft-tissue]
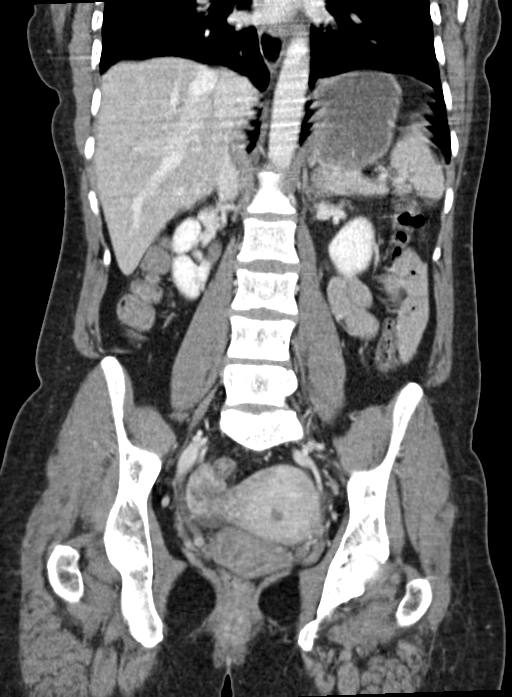
[im 62/112  soft-tissue]
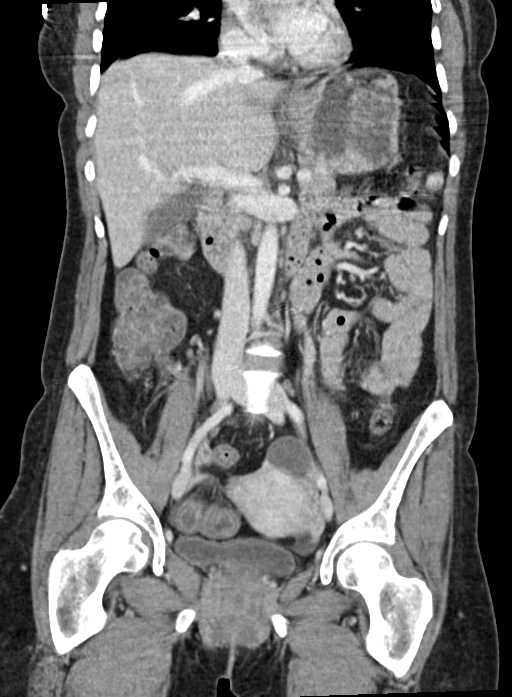

[15 of 46 positions shown; findings below may reference images not displayed]

FINDINGS: Lower chest: The lung bases are clear of acute process. No pleural
effusion or pulmonary lesions. The heart is normal in size. No
pericardial effusion. The distal esophagus and aorta are
unremarkable.

Hepatobiliary: No focal hepatic lesions or intrahepatic biliary
dilatation. The gallbladder appears normal. No common bile duct
dilatation.

Pancreas: No mass, inflammation or ductal dilatation.

Spleen: Normal size.  No focal lesions.

Adrenals/Urinary Tract: The adrenal glands kidneys are unremarkable.
No renal, ureteral or bladder calculi or mass.

Stomach/Bowel: The stomach and duodenum are unremarkable. There is a
fairly significant inflammatory process involving the distal and
terminal ileum. This could be an infectious or inflammatory ileitis
and Crohn's disease would certainly be a strong consideration. The
colon is unremarkable. No inflammatory changes, mass lesions or
obstructive findings. Scattered sigmoid diverticulosis without
findings for acute diverticulitis. The appendix is normal.

Vascular/Lymphatic: The aorta is normal in caliber. No dissection.
The branch vessels are patent. The major venous structures are
patent. No mesenteric or retroperitoneal mass or adenopathy. Small
scattered lymph nodes are noted.

Reproductive: Simple appearing 3.0 x 2.6 cm cyst associate with the
left ovary. Right ovary is normal. The uterus appears normal.

Other: No pelvic mass or pelvic lymphadenopathy. Small amount of
fluid surrounding the inflamed distal ileum. There is a small
periumbilical abdominal wall hernia containing fat and a loop of
small bowel but no obstruction or incarceration.

Musculoskeletal: No significant bony findings.
IMPRESSION: 1. Inflammatory or infectious distal and terminal ileitis. Crohn's
disease would certainly be a strong consideration.
2. Simple appearing 3 cm cyst associated with the left ovary.
3. Periumbilical abdominal wall hernia containing fat and a small
bowel loop but no incarceration or obstruction.
